# Patient Record
Sex: Female | Born: 1950 | Race: White | Hispanic: No | Smoking: Current every day smoker
Health system: Southern US, Community
[De-identification: ages and names within clinical notes are randomized; demographics above are authoritative.]

## PROBLEM LIST (undated history)

## (undated) DIAGNOSIS — E7800 Pure hypercholesterolemia, unspecified: Secondary | ICD-10-CM

## (undated) DIAGNOSIS — J40 Bronchitis, not specified as acute or chronic: Secondary | ICD-10-CM

## (undated) DIAGNOSIS — Z860100 Personal history of colon polyps, unspecified: Secondary | ICD-10-CM

## (undated) DIAGNOSIS — R05 Cough: Secondary | ICD-10-CM

## (undated) DIAGNOSIS — D509 Iron deficiency anemia, unspecified: Secondary | ICD-10-CM

## (undated) DIAGNOSIS — K922 Gastrointestinal hemorrhage, unspecified: Secondary | ICD-10-CM

## (undated) DIAGNOSIS — M549 Dorsalgia, unspecified: Secondary | ICD-10-CM

## (undated) DIAGNOSIS — I2510 Atherosclerotic heart disease of native coronary artery without angina pectoris: Secondary | ICD-10-CM

## (undated) DIAGNOSIS — G4730 Sleep apnea, unspecified: Secondary | ICD-10-CM

## (undated) DIAGNOSIS — R51 Headache: Secondary | ICD-10-CM

## (undated) DIAGNOSIS — R238 Other skin changes: Secondary | ICD-10-CM

## (undated) DIAGNOSIS — R059 Cough, unspecified: Secondary | ICD-10-CM

## (undated) DIAGNOSIS — F419 Anxiety disorder, unspecified: Secondary | ICD-10-CM

## (undated) DIAGNOSIS — G8929 Other chronic pain: Secondary | ICD-10-CM

## (undated) DIAGNOSIS — R233 Spontaneous ecchymoses: Secondary | ICD-10-CM

## (undated) DIAGNOSIS — K219 Gastro-esophageal reflux disease without esophagitis: Secondary | ICD-10-CM

## (undated) DIAGNOSIS — I10 Essential (primary) hypertension: Secondary | ICD-10-CM

## (undated) DIAGNOSIS — M1990 Unspecified osteoarthritis, unspecified site: Secondary | ICD-10-CM

## (undated) DIAGNOSIS — Q2730 Arteriovenous malformation, site unspecified: Secondary | ICD-10-CM

## (undated) DIAGNOSIS — J449 Chronic obstructive pulmonary disease, unspecified: Secondary | ICD-10-CM

## (undated) HISTORY — PX: ESOPHAGOGASTRODUODENOSCOPY: SHX1529

## (undated) HISTORY — PX: TUBAL LIGATION: SHX77

## (undated) HISTORY — DX: Chronic obstructive pulmonary disease, unspecified: J44.9

## (undated) HISTORY — PX: COLONOSCOPY: SHX174

## (undated) HISTORY — PX: OTHER SURGICAL HISTORY: SHX169

---

## 1992-07-30 HISTORY — PX: ABDOMINAL HYSTERECTOMY: SHX81

## 2003-07-31 HISTORY — PX: CORONARY ANGIOPLASTY: SHX604

## 2004-03-20 ENCOUNTER — Ambulatory Visit (HOSPITAL_COMMUNITY): Admission: RE | Admit: 2004-03-20 | Discharge: 2004-03-21 | Payer: Self-pay | Admitting: Cardiovascular Disease

## 2004-03-20 HISTORY — PX: OTHER SURGICAL HISTORY: SHX169

## 2004-04-11 ENCOUNTER — Observation Stay (HOSPITAL_COMMUNITY): Admission: EM | Admit: 2004-04-11 | Discharge: 2004-04-12 | Payer: Self-pay | Admitting: Emergency Medicine

## 2004-06-08 ENCOUNTER — Emergency Department (HOSPITAL_COMMUNITY): Admission: EM | Admit: 2004-06-08 | Discharge: 2004-06-08 | Payer: Self-pay | Admitting: Emergency Medicine

## 2004-06-27 ENCOUNTER — Ambulatory Visit: Payer: Self-pay | Admitting: Internal Medicine

## 2005-05-09 ENCOUNTER — Ambulatory Visit: Payer: Self-pay | Admitting: Internal Medicine

## 2005-05-25 ENCOUNTER — Ambulatory Visit: Payer: Self-pay | Admitting: Internal Medicine

## 2005-05-25 ENCOUNTER — Encounter (INDEPENDENT_AMBULATORY_CARE_PROVIDER_SITE_OTHER): Payer: Self-pay | Admitting: Specialist

## 2005-05-25 DIAGNOSIS — K2980 Duodenitis without bleeding: Secondary | ICD-10-CM | POA: Insufficient documentation

## 2006-06-11 ENCOUNTER — Other Ambulatory Visit: Admission: RE | Admit: 2006-06-11 | Discharge: 2006-06-11 | Payer: Self-pay | Admitting: *Deleted

## 2007-05-16 ENCOUNTER — Ambulatory Visit: Payer: Self-pay | Admitting: Internal Medicine

## 2007-05-16 LAB — CONVERTED CEMR LAB
ALT: 18 units/L (ref 0–35)
AST: 24 units/L (ref 0–37)
Albumin: 3.5 g/dL (ref 3.5–5.2)
Alkaline Phosphatase: 84 units/L (ref 39–117)
Amylase: 95 units/L (ref 27–131)
BUN: 10 mg/dL (ref 6–23)
Basophils Absolute: 0.1 10*3/uL (ref 0.0–0.1)
Basophils Relative: 1.2 % — ABNORMAL HIGH (ref 0.0–1.0)
Bilirubin, Direct: 0.1 mg/dL (ref 0.0–0.3)
CO2: 31 meq/L (ref 19–32)
Calcium: 8.9 mg/dL (ref 8.4–10.5)
Chloride: 107 meq/L (ref 96–112)
Creatinine, Ser: 0.8 mg/dL (ref 0.4–1.2)
Eosinophils Absolute: 0.2 10*3/uL (ref 0.0–0.6)
Eosinophils Relative: 2.8 % (ref 0.0–5.0)
GFR calc Af Amer: 96 mL/min
GFR calc non Af Amer: 79 mL/min
Glucose, Bld: 196 mg/dL — ABNORMAL HIGH (ref 70–99)
HCT: 38.3 % (ref 36.0–46.0)
Hemoglobin: 13.2 g/dL (ref 12.0–15.0)
Lipase: 41 units/L (ref 11.0–59.0)
Lymphocytes Relative: 38.1 % (ref 12.0–46.0)
MCHC: 34.4 g/dL (ref 30.0–36.0)
MCV: 87 fL (ref 78.0–100.0)
Monocytes Absolute: 0.6 10*3/uL (ref 0.2–0.7)
Monocytes Relative: 6.9 % (ref 3.0–11.0)
Neutro Abs: 4.5 10*3/uL (ref 1.4–7.7)
Neutrophils Relative %: 51 % (ref 43.0–77.0)
Platelets: 312 10*3/uL (ref 150–400)
Potassium: 4.1 meq/L (ref 3.5–5.1)
RBC: 4.41 M/uL (ref 3.87–5.11)
RDW: 13.1 % (ref 11.5–14.6)
Sodium: 142 meq/L (ref 135–145)
Total Bilirubin: 0.4 mg/dL (ref 0.3–1.2)
Total Protein: 6.1 g/dL (ref 6.0–8.3)
WBC: 8.7 10*3/uL (ref 4.5–10.5)

## 2007-05-21 ENCOUNTER — Encounter: Admission: RE | Admit: 2007-05-21 | Discharge: 2007-05-21 | Payer: Self-pay | Admitting: Internal Medicine

## 2007-05-27 ENCOUNTER — Ambulatory Visit (HOSPITAL_COMMUNITY): Admission: RE | Admit: 2007-05-27 | Discharge: 2007-05-27 | Payer: Self-pay | Admitting: Internal Medicine

## 2007-10-02 DIAGNOSIS — K589 Irritable bowel syndrome without diarrhea: Secondary | ICD-10-CM

## 2007-10-02 DIAGNOSIS — D126 Benign neoplasm of colon, unspecified: Secondary | ICD-10-CM

## 2007-10-02 DIAGNOSIS — I10 Essential (primary) hypertension: Secondary | ICD-10-CM

## 2007-10-02 DIAGNOSIS — J449 Chronic obstructive pulmonary disease, unspecified: Secondary | ICD-10-CM

## 2007-10-02 DIAGNOSIS — N809 Endometriosis, unspecified: Secondary | ICD-10-CM | POA: Insufficient documentation

## 2007-10-02 DIAGNOSIS — E7800 Pure hypercholesterolemia, unspecified: Secondary | ICD-10-CM | POA: Insufficient documentation

## 2007-10-02 DIAGNOSIS — J4489 Other specified chronic obstructive pulmonary disease: Secondary | ICD-10-CM | POA: Insufficient documentation

## 2007-10-02 DIAGNOSIS — K2990 Gastroduodenitis, unspecified, without bleeding: Secondary | ICD-10-CM

## 2007-10-02 DIAGNOSIS — I2510 Atherosclerotic heart disease of native coronary artery without angina pectoris: Secondary | ICD-10-CM

## 2007-10-02 DIAGNOSIS — K2970 Gastritis, unspecified, without bleeding: Secondary | ICD-10-CM | POA: Insufficient documentation

## 2008-08-16 ENCOUNTER — Encounter (INDEPENDENT_AMBULATORY_CARE_PROVIDER_SITE_OTHER): Payer: Self-pay | Admitting: *Deleted

## 2008-08-20 ENCOUNTER — Telehealth: Payer: Self-pay | Admitting: Internal Medicine

## 2008-08-25 ENCOUNTER — Ambulatory Visit: Payer: Self-pay | Admitting: Internal Medicine

## 2008-08-25 ENCOUNTER — Telehealth: Payer: Self-pay | Admitting: Internal Medicine

## 2008-08-25 DIAGNOSIS — D509 Iron deficiency anemia, unspecified: Secondary | ICD-10-CM

## 2008-08-26 ENCOUNTER — Encounter: Payer: Self-pay | Admitting: Internal Medicine

## 2008-08-26 ENCOUNTER — Ambulatory Visit: Payer: Self-pay | Admitting: Internal Medicine

## 2008-08-27 ENCOUNTER — Telehealth: Payer: Self-pay | Admitting: Internal Medicine

## 2008-08-28 ENCOUNTER — Encounter: Payer: Self-pay | Admitting: Internal Medicine

## 2008-09-01 ENCOUNTER — Ambulatory Visit: Payer: Self-pay | Admitting: Cardiology

## 2008-09-02 ENCOUNTER — Telehealth: Payer: Self-pay | Admitting: Internal Medicine

## 2008-10-04 ENCOUNTER — Ambulatory Visit: Payer: Self-pay | Admitting: Internal Medicine

## 2008-10-04 DIAGNOSIS — R195 Other fecal abnormalities: Secondary | ICD-10-CM

## 2008-10-04 LAB — CONVERTED CEMR LAB
Basophils Absolute: 0 10*3/uL (ref 0.0–0.1)
Basophils Relative: 0.4 % (ref 0.0–3.0)
Eosinophils Absolute: 0.2 10*3/uL (ref 0.0–0.7)
Eosinophils Relative: 2.9 % (ref 0.0–5.0)
HCT: 38.5 % (ref 36.0–46.0)
Hemoglobin: 12.9 g/dL (ref 12.0–15.0)
Iron: 103 ug/dL (ref 42–145)
Lymphocytes Relative: 35.9 % (ref 12.0–46.0)
MCHC: 33.6 g/dL (ref 30.0–36.0)
MCV: 78.2 fL (ref 78.0–100.0)
Monocytes Absolute: 0.6 10*3/uL (ref 0.1–1.0)
Monocytes Relative: 6.8 % (ref 3.0–12.0)
Neutro Abs: 4.3 10*3/uL (ref 1.4–7.7)
Neutrophils Relative %: 54 % (ref 43.0–77.0)
Platelets: 275 10*3/uL (ref 150–400)
RBC: 4.92 M/uL (ref 3.87–5.11)
RDW: 27.8 % — ABNORMAL HIGH (ref 11.5–14.6)
Saturation Ratios: 28.7 % (ref 20.0–50.0)
Transferrin: 256.4 mg/dL (ref 212.0–360.0)
WBC: 7.8 10*3/uL (ref 4.5–10.5)

## 2008-10-05 ENCOUNTER — Telehealth: Payer: Self-pay | Admitting: Internal Medicine

## 2008-10-14 ENCOUNTER — Ambulatory Visit: Payer: Self-pay | Admitting: Gastroenterology

## 2008-10-14 ENCOUNTER — Encounter: Payer: Self-pay | Admitting: Internal Medicine

## 2008-10-27 ENCOUNTER — Telehealth: Payer: Self-pay | Admitting: Internal Medicine

## 2009-07-30 HISTORY — PX: BACK SURGERY: SHX140

## 2009-10-21 ENCOUNTER — Encounter: Payer: Self-pay | Admitting: Internal Medicine

## 2009-11-02 ENCOUNTER — Ambulatory Visit: Payer: Self-pay | Admitting: Internal Medicine

## 2009-11-02 ENCOUNTER — Telehealth: Payer: Self-pay | Admitting: Internal Medicine

## 2009-11-02 LAB — CONVERTED CEMR LAB
Basophils Absolute: 0 10*3/uL (ref 0.0–0.1)
Basophils Relative: 0.5 % (ref 0.0–3.0)
Eosinophils Absolute: 0.2 10*3/uL (ref 0.0–0.7)
Eosinophils Relative: 2.9 % (ref 0.0–5.0)
HCT: 35.1 % — ABNORMAL LOW (ref 36.0–46.0)
Hemoglobin: 11.9 g/dL — ABNORMAL LOW (ref 12.0–15.0)
Iron: 23 ug/dL — ABNORMAL LOW (ref 42–145)
Lymphocytes Relative: 32.4 % (ref 12.0–46.0)
Lymphs Abs: 2.3 10*3/uL (ref 0.7–4.0)
MCHC: 33.8 g/dL (ref 30.0–36.0)
MCV: 86.2 fL (ref 78.0–100.0)
Monocytes Absolute: 0.5 10*3/uL (ref 0.1–1.0)
Monocytes Relative: 7.4 % (ref 3.0–12.0)
Neutro Abs: 3.9 10*3/uL (ref 1.4–7.7)
Neutrophils Relative %: 56.8 % (ref 43.0–77.0)
Platelets: 329 10*3/uL (ref 150.0–400.0)
RBC: 4.07 M/uL (ref 3.87–5.11)
RDW: 14.5 % (ref 11.5–14.6)
Saturation Ratios: 6.1 % — ABNORMAL LOW (ref 20.0–50.0)
Transferrin: 271.1 mg/dL (ref 212.0–360.0)
Vitamin B-12: 376 pg/mL (ref 211–911)
WBC: 6.9 10*3/uL (ref 4.5–10.5)

## 2009-11-08 ENCOUNTER — Encounter: Payer: Self-pay | Admitting: Internal Medicine

## 2009-11-11 ENCOUNTER — Telehealth: Payer: Self-pay | Admitting: Internal Medicine

## 2009-11-11 ENCOUNTER — Encounter (HOSPITAL_COMMUNITY): Admission: RE | Admit: 2009-11-11 | Discharge: 2010-02-09 | Payer: Self-pay | Admitting: Internal Medicine

## 2010-01-03 ENCOUNTER — Ambulatory Visit: Payer: Self-pay | Admitting: Internal Medicine

## 2010-01-03 LAB — CONVERTED CEMR LAB
Basophils Absolute: 0.1 10*3/uL (ref 0.0–0.1)
Basophils Relative: 0.9 % (ref 0.0–3.0)
Eosinophils Absolute: 0.3 10*3/uL (ref 0.0–0.7)
Eosinophils Relative: 3.4 % (ref 0.0–5.0)
HCT: 39.8 % (ref 36.0–46.0)
Hemoglobin: 13.5 g/dL (ref 12.0–15.0)
Iron: 96 ug/dL (ref 42–145)
Lymphocytes Relative: 27.8 % (ref 12.0–46.0)
Lymphs Abs: 2.1 10*3/uL (ref 0.7–4.0)
MCHC: 34 g/dL (ref 30.0–36.0)
MCV: 86.1 fL (ref 78.0–100.0)
Monocytes Absolute: 0.6 10*3/uL (ref 0.1–1.0)
Monocytes Relative: 8 % (ref 3.0–12.0)
Neutro Abs: 4.5 10*3/uL (ref 1.4–7.7)
Neutrophils Relative %: 59.9 % (ref 43.0–77.0)
Platelets: 267 10*3/uL (ref 150.0–400.0)
RBC: 4.62 M/uL (ref 3.87–5.11)
RDW: 17.4 % — ABNORMAL HIGH (ref 11.5–14.6)
Saturation Ratios: 31.3 % (ref 20.0–50.0)
Transferrin: 219.4 mg/dL (ref 212.0–360.0)
WBC: 7.5 10*3/uL (ref 4.5–10.5)

## 2010-01-04 ENCOUNTER — Ambulatory Visit: Payer: Self-pay | Admitting: Internal Medicine

## 2010-02-12 ENCOUNTER — Encounter: Admission: RE | Admit: 2010-02-12 | Discharge: 2010-02-12 | Payer: Self-pay | Admitting: Orthopedic Surgery

## 2010-03-25 ENCOUNTER — Encounter: Admission: RE | Admit: 2010-03-25 | Discharge: 2010-03-25 | Payer: Self-pay | Admitting: Orthopedic Surgery

## 2010-04-04 ENCOUNTER — Ambulatory Visit: Payer: Self-pay | Admitting: Internal Medicine

## 2010-05-22 ENCOUNTER — Ambulatory Visit: Payer: Self-pay | Admitting: Surgery

## 2010-06-05 LAB — CONVERTED CEMR LAB
Basophils Absolute: 0.1 10*3/uL (ref 0.0–0.1)
Basophils Relative: 0.7 % (ref 0.0–3.0)
Eosinophils Absolute: 0.2 10*3/uL (ref 0.0–0.7)
Eosinophils Relative: 2.5 % (ref 0.0–5.0)
HCT: 39.5 % (ref 36.0–46.0)
Hemoglobin: 13.6 g/dL (ref 12.0–15.0)
Iron: 39 ug/dL — ABNORMAL LOW (ref 42–145)
Lymphocytes Relative: 35.3 % (ref 12.0–46.0)
Lymphs Abs: 2.9 10*3/uL (ref 0.7–4.0)
MCHC: 34.6 g/dL (ref 30.0–36.0)
MCV: 90.4 fL (ref 78.0–100.0)
Monocytes Absolute: 0.7 10*3/uL (ref 0.1–1.0)
Monocytes Relative: 8.3 % (ref 3.0–12.0)
Neutro Abs: 4.4 10*3/uL (ref 1.4–7.7)
Neutrophils Relative %: 53.2 % (ref 43.0–77.0)
Platelets: 288 10*3/uL (ref 150.0–400.0)
RBC: 4.36 M/uL (ref 3.87–5.11)
RDW: 15.1 % — ABNORMAL HIGH (ref 11.5–14.6)
Saturation Ratios: 11.4 % — ABNORMAL LOW (ref 20.0–50.0)
Transferrin: 245.2 mg/dL (ref 212.0–360.0)
WBC: 8.3 10*3/uL (ref 4.5–10.5)

## 2010-06-19 ENCOUNTER — Ambulatory Visit: Payer: Self-pay | Admitting: Internal Medicine

## 2010-06-20 LAB — CONVERTED CEMR LAB
Basophils Absolute: 0.1 10*3/uL (ref 0.0–0.1)
Eosinophils Absolute: 0.2 10*3/uL (ref 0.0–0.7)
Hemoglobin: 13.3 g/dL (ref 12.0–15.0)
Lymphocytes Relative: 39.8 % (ref 12.0–46.0)
MCHC: 33.9 g/dL (ref 30.0–36.0)
MCV: 90.6 fL (ref 78.0–100.0)
Monocytes Absolute: 0.5 10*3/uL (ref 0.1–1.0)
Neutro Abs: 3.7 10*3/uL (ref 1.4–7.7)
Neutrophils Relative %: 49.9 % (ref 43.0–77.0)
RDW: 13.9 % (ref 11.5–14.6)
Saturation Ratios: 20.7 % (ref 20.0–50.0)

## 2010-06-28 ENCOUNTER — Inpatient Hospital Stay (HOSPITAL_COMMUNITY): Admission: RE | Admit: 2010-06-28 | Discharge: 2010-06-30 | Payer: Self-pay | Admitting: Orthopedic Surgery

## 2010-06-29 ENCOUNTER — Encounter (INDEPENDENT_AMBULATORY_CARE_PROVIDER_SITE_OTHER): Payer: Self-pay | Admitting: Orthopedic Surgery

## 2010-08-29 NOTE — Progress Notes (Signed)
Summary: Follow up W/ Dr Juanda Chance?   Phone Note Call from Patient Call back at [phone]   Call For: Dr Juanda Chance Reason for Call: Talk to Nurse Summary of Call: When does she need to follow up with Dr Juanda Chance? Initial call taken by: Leanor Kail [facility],  November 11, 2009 5:15 PM  Follow-up for Phone Call        Dr Juanda Chance ~ When would you like to see patient in follow up? Follow-up by: Hortense Ramal CMA Duncan Dull),  November 14, 2009 8:48 AM  Additional Follow-up for Phone Call Additional follow up Details #1::        beginning of June, please have CBC, Iron studies prior to her visit. Additional Follow-up by: Hart Carwin MD,  November 14, 2009 1:29 PM    Additional Follow-up for Phone Call Additional follow up Details #2::    I have left a message for the patient to call back.  Hortense Ramal CMA Duncan Dull)  November 14, 2009 4:07 PM   Patient has been scheduled for follow up appointment on 01/04/10 and will have labs drawn beforehand. She would like to know if she needs to restart her iron supplements since her H/H and iron saturations were low on her labs drawn 11/02/09.Marland KitchenMarland KitchenMarland KitchenShe had an iron infusion but is unclear as to what she needs to do next....please advise. Hortense Ramal CMA Duncan Dull)  November 15, 2009 8:57 AM   Additional Follow-up for Phone Call Additional follow up Details #3:: Details for Additional Follow-up Action Taken: She ought to restart the iron supplements now. Hart Carwin MD,  November 15, 2009 10:38 PM  Patient advised to restart iron supplements. She verbalizes understanding. Hortense Ramal CMA Duncan Dull)  November 16, 2009 8:17 AM

## 2010-08-29 NOTE — Progress Notes (Signed)
Summary: Iron Infusion Scheduled   Phone Note Outgoing Call   Call placed by: Laureen Ochs LPN,  November 02, 2009 3:39 PM Call placed to: Patient Summary of Call: See labs from 11-02-09. Pt. is scheduled for an Iron infusion, with a test dose first, at [facility] Short Stay on 11-04-09 at 8am. Pt. changed the appt. to 11-11-09. All instructions given to pt. by phone. Pt. instructed to call back as needed.  Initial call taken by: Laureen Ochs LPN,  November 02, 2009 3:40 PM     Appended Document: Iron Infusion Orders    Clinical Lists Changes  Orders: Added new Test order of Iron Infusion (Iron Inf) - Signed

## 2010-08-29 NOTE — Letter (Signed)
Summary: Surgery Clearance/[facility]  Surgery Clearance/[facility]   Imported By: Lester  04/26/2010 10:56:55  _____________________________________________________________________  External Attachment:    Type:   Image     Comment:   External Document

## 2010-08-29 NOTE — Assessment & Plan Note (Signed)
Summary: routine f/y anemia//dn    History of Present Illness Visit Type: Follow-up Visit Primary GI MD: Lina Sar MD Primary Provider: Karyl Kinnier, MD Requesting Provider: n/a Chief Complaint: Patient here for f/u anemia. She denies any GI symptoms at this time. History of Present Illness:   This is a 60 year old white female with iron deficiency anemia which has responded to oral iron supplements and an iron infusion. A small bowel capsule endoscopy in March showed one erosion, one small polyp and one AVM. She is feeling much better now. Her hemoglobin is up to 13.5, hematocrit is 39.8 and her iron saturation is up to 31%. She is also on B12 1,000 mcg monthly. She continues her Plavix and aspirin for coronary artery disease and is status post drug-eluting stent placement. She is up-to-date on her colonoscopy which was done in January 2010. That showed a hyperplastic polyp. She has a family history of colon cancer in her father. An upper endoscopy in January 2010 was essentially negative.   GI Review of Systems      Denies abdominal pain, acid reflux, belching, bloating, chest pain, dysphagia with liquids, dysphagia with solids, heartburn, loss of appetite, nausea, vomiting, vomiting blood, weight loss, and  weight gain.        Denies anal fissure, black tarry stools, change in bowel habit, constipation, diarrhea, diverticulosis, fecal incontinence, heme positive stool, hemorrhoids, irritable bowel syndrome, jaundice, light color stool, liver problems, rectal bleeding, and  rectal pain.    Current Medications (verified): 1)  Plavix 75 Mg Tabs (Clopidogrel Bisulfate) .Marland Kitchen.. 1 By Mouth Once Daily 2)  Adult Aspirin Low Strength 81 Mg Tbdp (Aspirin) .Marland Kitchen.. 1 By Mouth Once Daily 3)  Altace 2.5 Mg Caps (Ramipril) .... 2 Tabs By Mouth Once Daily 4)  Metoprolol Tartrate 25 Mg Tabs (Metoprolol Tartrate) .... Take 1/2 Tab By Mouth Two Times A Day 5)  Zocor 40 Mg Tabs (Simvastatin) .Marland Kitchen.. 1 By Mouth  Once Daily 6)  Nexium 40 Mg Cpdr (Esomeprazole Magnesium) .Marland Kitchen.. 1 Tab By Mouth Once Daily 7)  Eql Fish Oil 1000 Mg Caps (Omega-3 Fatty Acids) .Marland Kitchen.. 1 By Mouth Two Times A Day 8)  Calcium 1200 1200-1000 Mg-Unit Chew (Calcium Carbonate-Vit D-Min) .Marland Kitchen.. 1 By Mouth Once Daily 9)  Ferrous Sulfate 325 (65 Fe) Mg Tabs (Ferrous Sulfate) .Marland Kitchen.. 1 Tab Three Times A Day 10)  Folic Acid 1 Mg Tabs (Folic Acid) .Marland Kitchen.. 1 By Mouth Once Daily 11)  Cyanocobalamin 1000 Mcg/ml Soln (Cyanocobalamin) .... Inject 1 Ml Intramuscularly (In Thigh or Arm) Once Per Month. **pharmacy Please Include Appropriate Syringes** 12)  Ambien 5 Mg Tabs (Zolpidem Tartrate) .... Take 1 Tab By Mouth At Bedtime As Needed  Allergies (verified): 1)  ! Codeine  Past History:  Past Medical History: Reviewed history from 10/02/2007 and no changes required. Current Problems:  COPD (ICD-496) ENDOMETRIOSIS (ICD-617.9) IBS (ICD-564.1) COLONIC POLYPS (ICD-211.3) DUODENITIS, WITHOUT HEMORRHAGE (ICD-535.60) GASTRITIS (ICD-535.50) HYPERCHOLESTEROLEMIA (ICD-272.0) CAD (ICD-414.00) HYPERTENSION (ICD-401.9)  Past Surgical History: Reviewed history from 10/02/2007 and no changes required. total hysterectomy tubal ligation angioplasty/stent  Family History: Reviewed history from 08/25/2008 and no changes required. Father recently deceased as of 08-24-08 Family History of Breast Cancer:Sister Family History of Colon Cancer: father Family History of Prostate Cancer:Father Family History of Diabetes: Father Family History of Heart Disease: Father Family History of Liver Disease/Cirrhosis:Sister  Social History: Reviewed history from 08/25/2008 and no changes required. 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  Alcohol Use - no Daily Caffeine Use1  cup Illicit Drug Use - no Patient gets regular exercise.  Review of Systems       The patient complains of anemia.  The patient denies allergy/sinus, anxiety-new,  arthritis/joint pain, back pain, blood in urine, breast changes/lumps, change in vision, confusion, cough, coughing up blood, depression-new, fainting, fatigue, fever, headaches-new, hearing problems, heart murmur, heart rhythm changes, itching, menstrual pain, muscle pains/cramps, night sweats, nosebleeds, pregnancy symptoms, shortness of breath, skin rash, sleeping problems, sore throat, swelling of feet/legs, swollen lymph glands, thirst - excessive , urination - excessive , urination changes/pain, urine leakage, vision changes, and voice change.         Pertinent positive and negative review of systems were noted in the above HPI. All other ROS was otherwise negative.   Vital Signs:  Patient profile:   60 year old female Height:      61 inches Weight:      160.25 pounds BMI:     30.39 BSA:     1.72 Pulse rate:   72 / minute Pulse rhythm:   regular BP sitting:   122 / 80  (left arm)  Vitals Entered By: Lamona Curl CMA Duncan Dull) (January 04, 2010 8:31 AM)  Physical Exam  General:  Well developed, well nourished, no acute distress. Neck:  Supple; no masses or thyromegaly. Lungs:  Clear throughout to auscultation. Heart:  Regular rate and rhythm; no murmurs, rubs,  or bruits. Abdomen:  soft with mild tenderness in left lower quadrant. Hyperactive bowel sounds. No palpable mass Extremities:  No clubbing, cyanosis, edema or deformities noted. Skin:  palmar erythema   Impression & Recommendations:  Problem # 1:  FM HX MALIGNANT NEOPLASM GASTROINTESTINAL TRACT (ICD-V16.0) She is up-to-date on her colonoscopy. Her next exam will be due in January 2015.  Problem # 2:  IBS (ICD-564.1) We will consider an upper abdominal ultrasound. She has a strong family history of gallbladder disease.  Problem # 3:  ANEMIA, IRON DEFICIENCY (ICD-280.9) This has been corrected with an iron infusion and oral iron supplements. She may discontinue her iron supplements at this time and have her CBC  repeated in 3 months.  Patient Instructions: 1)  repeat CBC 3 months. 2)  Discontinue oral iron. 3)  Consider upper abdominal ultrasound for followup of bloating. 4)  Recall colonoscopy January 2015. 5)  Copy sent to : Dr Ethelene Hal 6)  The medication list was reviewed and reconciled.  All changed / newly prescribed medications were explained.  A complete medication list was provided to the patient / caregiver.

## 2010-08-29 NOTE — Miscellaneous (Signed)
Summary: Check Labs before Refills  ---- 10/21/2009 11:31 AM, Hortense Ramal CMA (AAMA) wrote: Dr Juanda Chance  I have a refill request for patient to get injectable b12.Marland KitchenMarland KitchenMarland Kitchenhowever, I dont see where we have gotten a b12 level on her in a long time. We got an IBC panel 3/8 of last year and it was normal....do you want Patient to have labs drawn before I refill? If so, which ones exactly would you like?   ---- 10/21/2009 1:10 PM, Hart Carwin MD wrote: last B12 level 07/2008 was 264pg, OK to repeat before refilling her B12., please check Iron studies, CBC  at the same time DB   I have left a message for patient to call back. Her lab orders have been entered in [facility]. Hortense Ramal CMA Duncan Dull)  October 21, 2009 1:45 PM    Patient has been advised that Dr Juanda Chance would like her to come for labs before we give her refills on her b12 refills. Patient verbalizes understanding. Hortense Ramal CMA Duncan Dull)  October 24, 2009 11:14 AM

## 2010-08-29 NOTE — Miscellaneous (Signed)
Summary: B12 Refill  Clinical Lists Changes  Medications: Rx of CYANOCOBALAMIN 1000 MCG/ML SOLN (CYANOCOBALAMIN) Inject 1 mL intramuscularly (in thigh or arm) once per month. **pharmacy please include appropriate syringes**;  #10 x 0;  Signed;  Entered by: Hortense Ramal CMA (AAMA);  Authorized by: Hart Carwin MD;  Method used: Electronically to [facility]. [phone]*, [address], [place], [place]  [address], Ph: [phone], Fax: [phone]    Prescriptions: CYANOCOBALAMIN 1000 MCG/ML SOLN (CYANOCOBALAMIN) Inject 1 mL intramuscularly (in thigh or arm) once per month. **pharmacy please include appropriate syringes**  #10 x 0   Entered by:   Hortense Ramal CMA (AAMA)   Authorized by:   Hart Carwin MD   Signed by:   Hortense Ramal CMA (AAMA) on 11/08/2009   Method used:   Electronically to        [facility]. [phone]* (retail)       [address]       [place], [place]  [address]       Ph: [phone]       Fax: [phone]   RxID:   [id number]

## 2010-10-04 IMAGING — CT CT ABDOMEN W/ CM
2 of 5 series · 17 of 46 positions shown, 19 images · IV contrast (Omnipaque 300)
Comparison: None

CT ABDOMEN

CLINICAL DATA: Anemia.  Family history of colon cancer.  Upper
abdominal pain.  Bloating.  Weight loss.

CT ABDOMEN AND PELVIS WITH CONTRAST
TECHNIQUE: Multidetector CT imaging of the abdomen and pelvis was
performed using the standard protocol following bolus
administration of intravenous contrast.
Contrast: 100 ml 2mnipaque-EII

[Series 2: abd_pel 5.0 b30f st · axial · 0.73mm/px · z∈[-426,-51]mm · 14 of 85 slices shown, 16 images]
[im 5/85  soft-tissue]
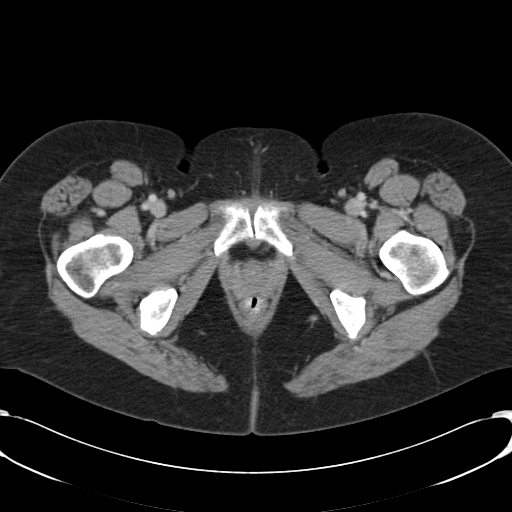
[im 5/85  bone]
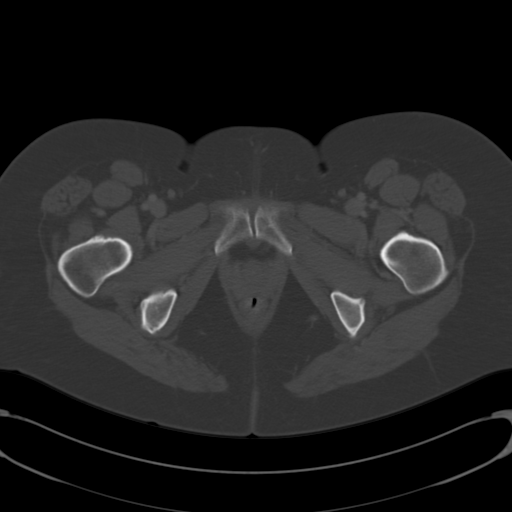
[im 10/85  soft-tissue]
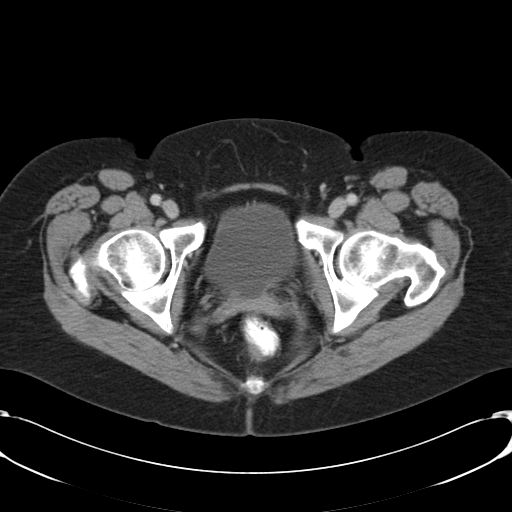
[im 19/85  soft-tissue]
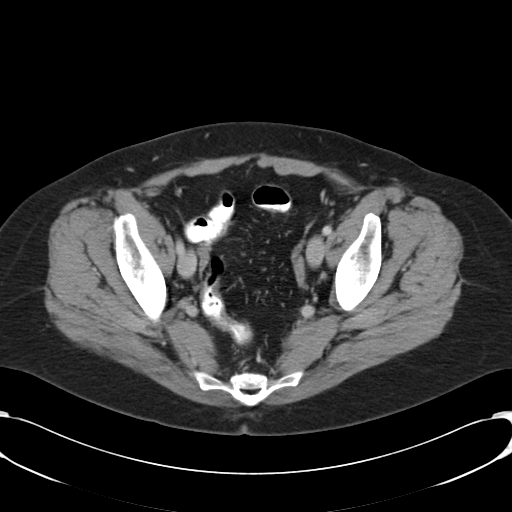
[im 24/85  soft-tissue]
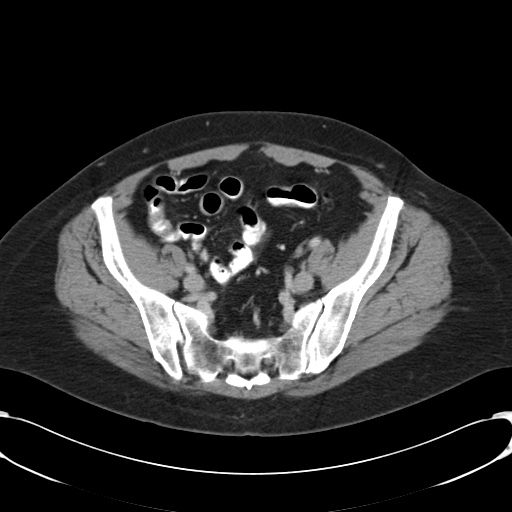
[im 29/85  soft-tissue]
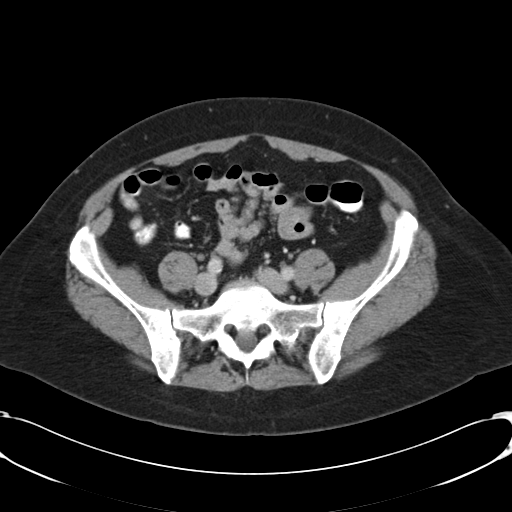
[im 33/85  soft-tissue]
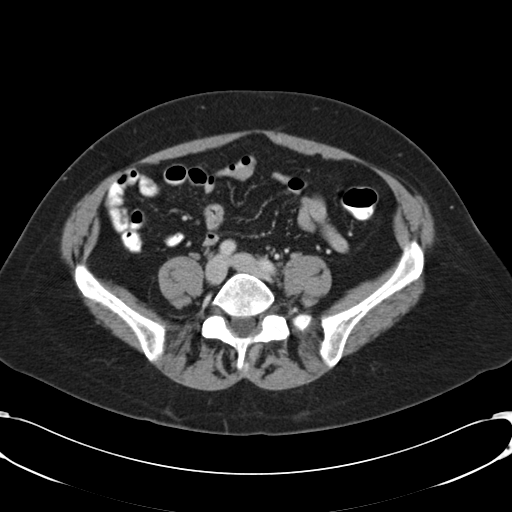
[im 38/85  soft-tissue]
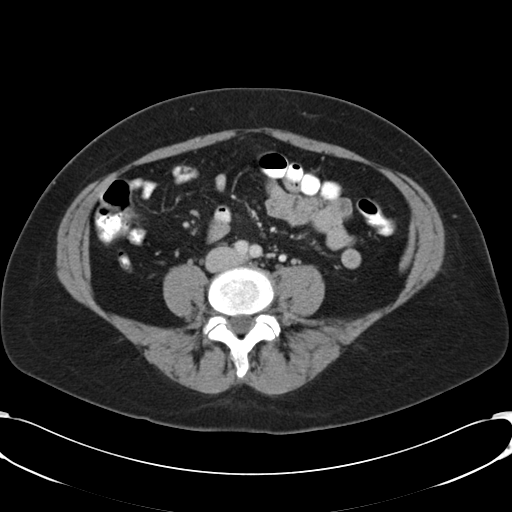
[im 47/85  soft-tissue]
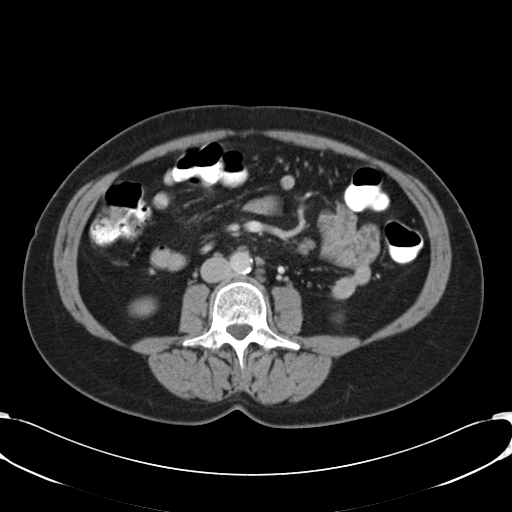
[im 52/85  soft-tissue]
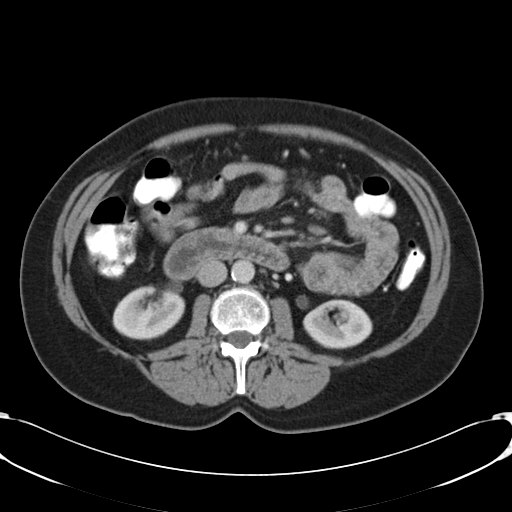
[im 52/85  bone]
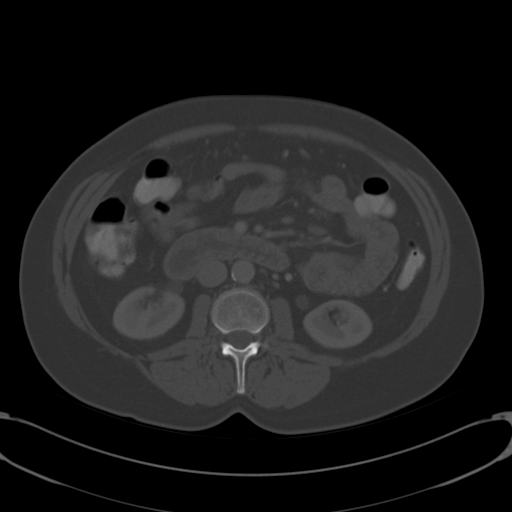
[im 57/85  soft-tissue]
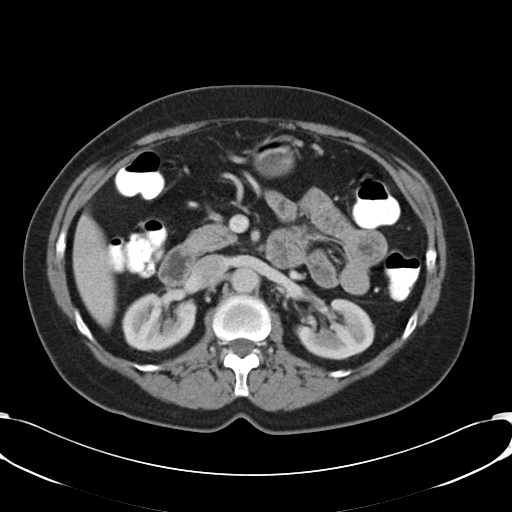
[im 61/85  soft-tissue]
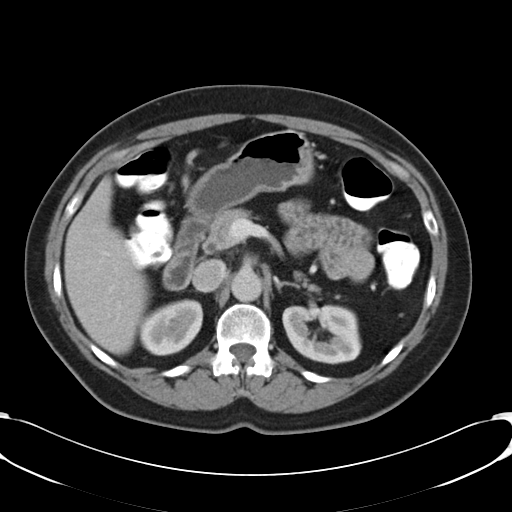
[im 66/85  soft-tissue]
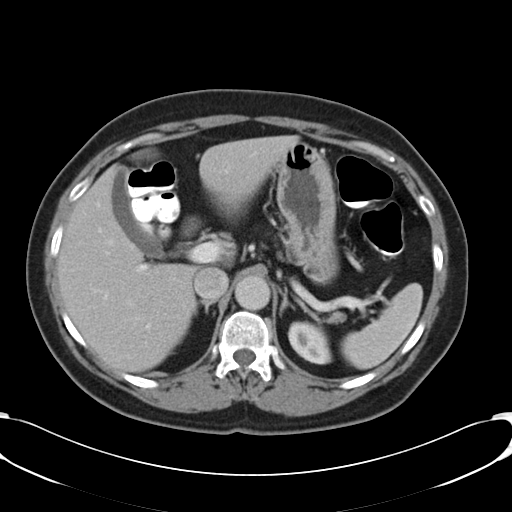
[im 75/85  soft-tissue]
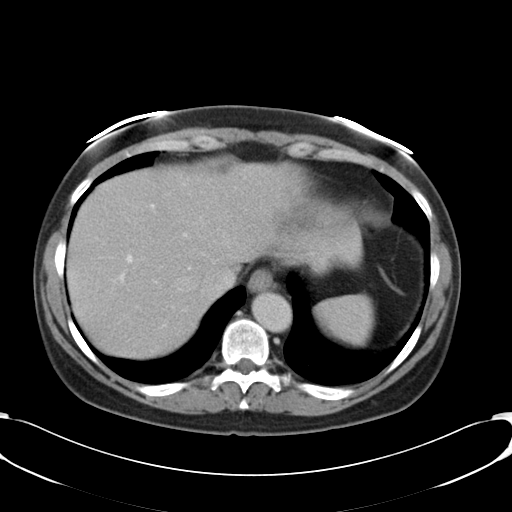
[im 80/85  soft-tissue]
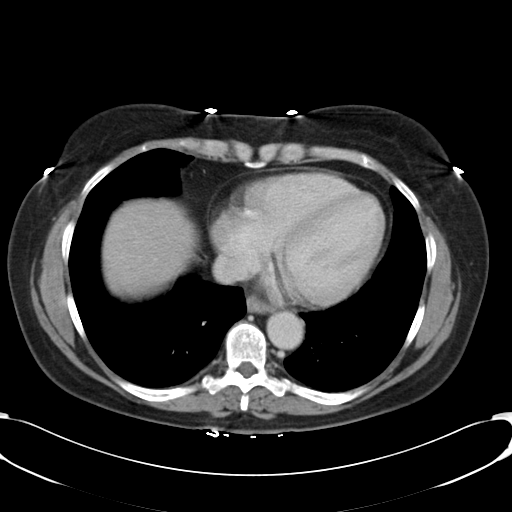

[Series 602: <mpr thick range> · coronal · 0.84mm/px · 3 of 82 slices shown]
[im 28/82  soft-tissue]
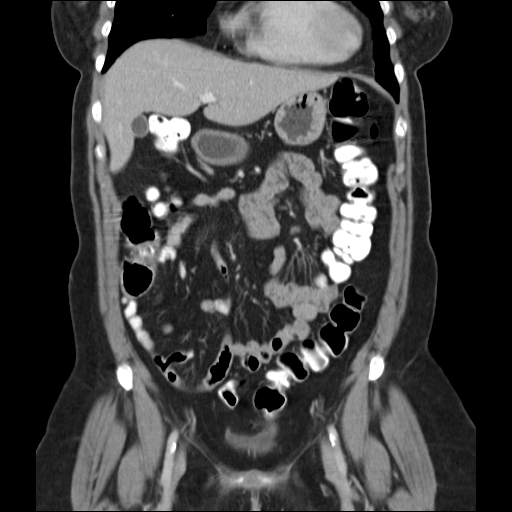
[im 37/82  soft-tissue]
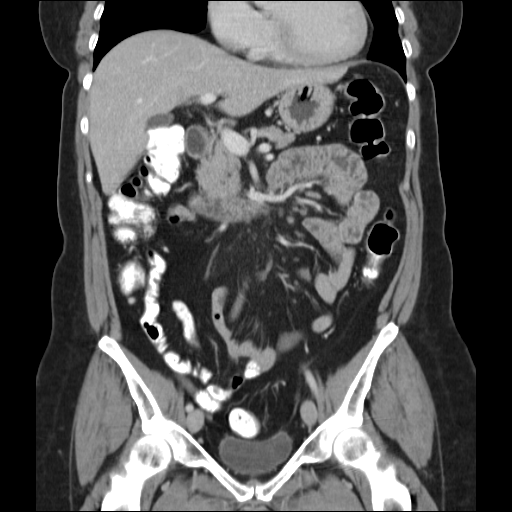
[im 46/82  soft-tissue]
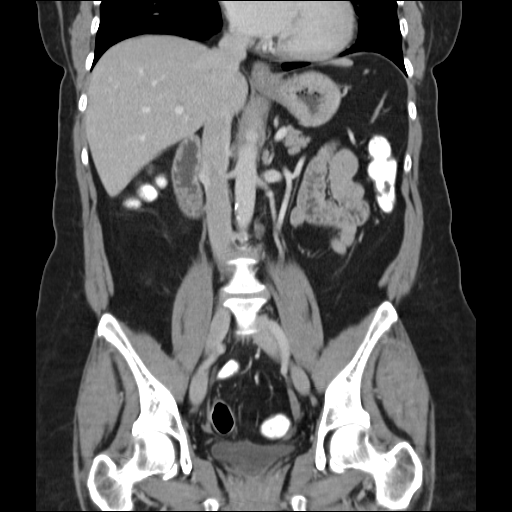

[17 of 46 positions shown; findings below may reference images not displayed]

FINDINGS: Lung bases are clear.  No pleural or pericardial fluid.
The liver has a normal appearance without focal lesions or biliary
ductal dilatation.  No calcified gallstones.  The spleen is normal.
The pancreas is normal.  The adrenal glands are normal.  The
kidneys are normal.  There is atherosclerosis of the aorta but no
aneurysm.  The IVC is normal.  No retroperitoneal mass or
adenopathy.  No free intraperitoneal fluid or air.  No bowel
pathology is evident in the abdominal portion of the scan.
IMPRESSION: Normal CT scan of the abdomen with the exception of atherosclerosis
of the aorta.

CT PELVIS
FINDINGS: No free fluid in the pelvis.  There has been previous
hysterectomy.  No sign of pelvic mass or adenopathy.  The appendix
appears normal.  No other bowel pathology is evident.  No
significant osseous finding.
IMPRESSION: Negative CT scan of the pelvis.

## 2010-10-09 LAB — URINE CULTURE: Colony Count: NO GROWTH

## 2010-10-09 LAB — URINALYSIS, ROUTINE W REFLEX MICROSCOPIC
Bilirubin Urine: NEGATIVE
Ketones, ur: NEGATIVE mg/dL
Nitrite: NEGATIVE
Specific Gravity, Urine: 1.01 (ref 1.005–1.030)
Urobilinogen, UA: 0.2 mg/dL (ref 0.0–1.0)
pH: 6 (ref 5.0–8.0)

## 2010-10-09 LAB — HEMOGLOBIN AND HEMATOCRIT, BLOOD
HCT: 28.1 % — ABNORMAL LOW (ref 36.0–46.0)
HCT: 30.3 % — ABNORMAL LOW (ref 36.0–46.0)
Hemoglobin: 9.2 g/dL — ABNORMAL LOW (ref 12.0–15.0)

## 2010-10-10 LAB — CBC
HCT: 39.5 % (ref 36.0–46.0)
MCHC: 33.7 g/dL (ref 30.0–36.0)
MCV: 89.2 fL (ref 78.0–100.0)
Platelets: 273 10*3/uL (ref 150–400)
RDW: 13.5 % (ref 11.5–15.5)
WBC: 7.8 10*3/uL (ref 4.0–10.5)

## 2010-10-10 LAB — TYPE AND SCREEN
Unit division: 0
Unit division: 0

## 2010-10-10 LAB — BASIC METABOLIC PANEL
BUN: 8 mg/dL (ref 6–23)
Chloride: 106 mEq/L (ref 96–112)
Creatinine, Ser: 0.84 mg/dL (ref 0.4–1.2)
Glucose, Bld: 89 mg/dL (ref 70–99)
Potassium: 4 mEq/L (ref 3.5–5.1)

## 2010-10-10 LAB — ABO/RH: ABO/RH(D): AB POS

## 2010-10-10 LAB — POCT I-STAT 4, (NA,K, GLUC, HGB,HCT)
Glucose, Bld: 115 mg/dL — ABNORMAL HIGH (ref 70–99)
HCT: 27 % — ABNORMAL LOW (ref 36.0–46.0)
Potassium: 3.2 mEq/L — ABNORMAL LOW (ref 3.5–5.1)
Sodium: 140 mEq/L (ref 135–145)

## 2010-10-16 ENCOUNTER — Other Ambulatory Visit: Payer: Self-pay

## 2010-10-19 ENCOUNTER — Telehealth: Payer: Self-pay | Admitting: *Deleted

## 2010-10-19 NOTE — Telephone Encounter (Signed)
Left message for patient to call back. She needs repeat labs.

## 2010-10-19 NOTE — Telephone Encounter (Signed)
Patient states that she will come for labs sometime between now and 10/26/10. Advised her to go to basement for labs.

## 2010-10-19 NOTE — Telephone Encounter (Signed)
Message copied by Vernia Buff on Thu Oct 19, 2010  3:33 PM ------      Message from: Vernia Buff      Created: Mon Oct 16, 2010 12:42 PM       Called patient. Did she call back?      ---- 06/20/2010 9:22 AM, Jesse Fall RN wrote:      Patient due for labs week on 10/15/09.

## 2010-11-06 ENCOUNTER — Other Ambulatory Visit (INDEPENDENT_AMBULATORY_CARE_PROVIDER_SITE_OTHER): Payer: Self-pay

## 2010-11-06 ENCOUNTER — Telehealth: Payer: Self-pay | Admitting: Internal Medicine

## 2010-11-06 DIAGNOSIS — D509 Iron deficiency anemia, unspecified: Secondary | ICD-10-CM

## 2010-11-06 LAB — CBC WITH DIFFERENTIAL/PLATELET
Basophils Absolute: 0.1 10*3/uL (ref 0.0–0.1)
Basophils Relative: 1 % (ref 0.0–3.0)
Eosinophils Absolute: 0.2 10*3/uL (ref 0.0–0.7)
MCHC: 31.5 g/dL (ref 30.0–36.0)
MCV: 73.2 fl — ABNORMAL LOW (ref 78.0–100.0)
Monocytes Absolute: 0.8 10*3/uL (ref 0.1–1.0)
Neutrophils Relative %: 49.1 % (ref 43.0–77.0)
RBC: 3.42 Mil/uL — ABNORMAL LOW (ref 3.87–5.11)
RDW: 22.2 % — ABNORMAL HIGH (ref 11.5–14.6)

## 2010-11-06 LAB — IBC PANEL
Iron: 12 ug/dL — ABNORMAL LOW (ref 42–145)
Saturation Ratios: 2.6 % — ABNORMAL LOW (ref 20.0–50.0)
Transferrin: 330.8 mg/dL (ref 212.0–360.0)

## 2010-11-07 ENCOUNTER — Telehealth: Payer: Self-pay | Admitting: *Deleted

## 2010-11-07 DIAGNOSIS — D509 Iron deficiency anemia, unspecified: Secondary | ICD-10-CM

## 2010-11-07 MED ORDER — IRON DEXTRAN 50 MG/ML IJ SOLN: INTRAMUSCULAR | Status: DC

## 2010-11-07 NOTE — Telephone Encounter (Signed)
Scheduled patient at [facility] short stay([place])  On 11/09/10 at 10:00 AM for IV iron. Left message for patient with the appointment. Orders faxed to Short stay.

## 2010-11-07 NOTE — Telephone Encounter (Signed)
Message copied by Jesse Fall on Tue Nov 07, 2010  8:41 AM ------      Message from: Lina Sar      Created: Mon Nov 06, 2010  5:46 PM       I have discussed  Low Hgb 7.9 with the pt, who is feeling slightly SOB, but otherwise OK. Hgb 9.2 in Dec [address]4540, had back surgery Nov,2011, Hgb before the surgery was 13.3. Please set up an Iron Infusion at [facility]. ASAP,she will continue oral iron in the meantime. Imfed IV infusion , NO test dose needed since she already had 1 infusion. Please give Pharmacy calculated dose, goal Hgb 13.5. Call pt on her mobile phone.

## 2010-11-08 ENCOUNTER — Telehealth: Payer: Self-pay | Admitting: Internal Medicine

## 2010-11-08 NOTE — Telephone Encounter (Signed)
Orders refaxed. Short stay notified.

## 2010-11-08 NOTE — Telephone Encounter (Signed)
Spoke with pt re: iron infusion, discussed severe anemia, , continue oral iron for now. We will arrange for an iron infusion ASAP.

## 2010-11-09 ENCOUNTER — Encounter (HOSPITAL_COMMUNITY): Payer: BC Managed Care – HMO | Attending: Internal Medicine

## 2010-11-09 ENCOUNTER — Other Ambulatory Visit (INDEPENDENT_AMBULATORY_CARE_PROVIDER_SITE_OTHER): Payer: BC Managed Care – HMO

## 2010-11-09 ENCOUNTER — Other Ambulatory Visit: Payer: Self-pay | Admitting: Internal Medicine

## 2010-11-09 DIAGNOSIS — D509 Iron deficiency anemia, unspecified: Secondary | ICD-10-CM

## 2010-11-09 NOTE — Telephone Encounter (Signed)
Left message for patient to call back  

## 2010-11-09 NOTE — Telephone Encounter (Signed)
Patient calling stating that she needs refills on B12 injectable. She has not given herself an injection since December 2012. Dr Juanda Chance, do you want patient to have a b12 level checked before we refill rx or do you want me to go ahead and give her the rx?

## 2010-11-09 NOTE — Telephone Encounter (Signed)
Yes, check B12 level, please.

## 2010-11-09 NOTE — Telephone Encounter (Signed)
Pt states that she will come for b12 levels today and once we get levels back, we can then let her know if she needs b12 injections.

## 2010-11-14 ENCOUNTER — Telehealth: Payer: Self-pay | Admitting: Internal Medicine

## 2010-11-14 NOTE — Telephone Encounter (Signed)
317pg, low normal,  DB

## 2010-11-14 NOTE — Telephone Encounter (Signed)
Patient wants to know the results of her Vitamin B 12 level. Please, advise

## 2010-11-15 NOTE — Telephone Encounter (Signed)
Left message with results as per Dr. Juanda Chance.

## 2010-12-04 ENCOUNTER — Telehealth: Payer: Self-pay | Admitting: Internal Medicine

## 2010-12-04 ENCOUNTER — Other Ambulatory Visit (INDEPENDENT_AMBULATORY_CARE_PROVIDER_SITE_OTHER): Payer: BC Managed Care – HMO

## 2010-12-04 DIAGNOSIS — D509 Iron deficiency anemia, unspecified: Secondary | ICD-10-CM

## 2010-12-04 LAB — IBC PANEL
Saturation Ratios: 18.4 % — ABNORMAL LOW (ref 20.0–50.0)
Transferrin: 237.1 mg/dL (ref 212.0–360.0)

## 2010-12-04 LAB — CBC WITH DIFFERENTIAL/PLATELET
Basophils Relative: 1 % (ref 0.0–3.0)
Eosinophils Relative: 2.4 % (ref 0.0–5.0)
Hemoglobin: 10.6 g/dL — ABNORMAL LOW (ref 12.0–15.0)
Lymphocytes Relative: 32.2 % (ref 12.0–46.0)
MCV: 80.7 fl (ref 78.0–100.0)
Neutro Abs: 4.7 10*3/uL (ref 1.4–7.7)
Neutrophils Relative %: 55.8 % (ref 43.0–77.0)
RBC: 3.83 Mil/uL — ABNORMAL LOW (ref 3.87–5.11)
WBC: 8.4 10*3/uL (ref 4.5–10.5)

## 2010-12-04 NOTE — Telephone Encounter (Signed)
I have left a message for the patient to come for lab work at her convenience.  She is asked to call back for any questions.

## 2010-12-04 NOTE — Telephone Encounter (Signed)
Patient had an iron infusion on 11/09/10.  She is requesting to have her iron levels checked.  Dr Juanda Chance please advise what labs she needs.

## 2010-12-04 NOTE — Telephone Encounter (Signed)
Please check CBC and Iron , TIBC, thanx DB

## 2010-12-05 ENCOUNTER — Telehealth: Payer: Self-pay | Admitting: *Deleted

## 2010-12-05 DIAGNOSIS — D509 Iron deficiency anemia, unspecified: Secondary | ICD-10-CM

## 2010-12-05 NOTE — Telephone Encounter (Signed)
Message copied by Jesse Fall on Tue Dec 05, 2010  9:14 AM ------      Message from: Lina Sar      Created: Mon Dec 04, 2010 10:41 PM       Please call pt with normal blood count but still low Iron. OK to continue oral Iron supplements daily. Please have CBC and Iron studies repeated in 3 months

## 2010-12-05 NOTE — Telephone Encounter (Signed)
Patient notified of lab results. Labs in [facility] for 03/07/11. Note to remind patient.

## 2010-12-12 NOTE — Assessment & Plan Note (Signed)
[facility]                         GASTROENTEROLOGY OFFICE NOTE   NAME:Garczynski, [name]                      MRN:          [id number]  DATE:05/16/2007                            DOB:          24-Aug-1950    GI OFFICE ADD-ON NOTE   PROBLEM:  Right upper quadrant pain.   HISTORY:  Laurin is a pleasant, 60 year old, white female, known to Dr.  Lina Sar.  Her primary physician is Dr. Ethelene Hal in [place].  She is  followed here for GERD and also has had problems with chronic  constipation.  She does have a history of coronary artery disease and is  status post stents in 2005 maintained on aspirin and Plavix.  She was  last seen here in 2006, had a colonoscopy, May 25, 2005.  She had a  tiny polyp removed from the sigmoid colon; otherwise a negative exam.  EGD was done in 2006 as well showing acute gastritis and duodenitis  without hemorrhage.  She is maintained on chronic Nexium and says she  has been taking it regularly.  Her current pain is new for her, has been  present over the past month.  She describes it as an intermittent achy  pain in the right upper quadrant, which at times radiates into her back  on the right and at times across her upper abdomen.  She has also  noticed an increase in indigestion, some vague nausea, and without  vomiting.  She has not had any fever or chills, does definitely feel  that her pain is worse postprandially.  She has not had any change in  her bowel habits, says she tends to fluctuate between loose stools and  constipation, has not noted any melena or hematochezia.  She denies any  NSAID use and does take a coated baby aspirin once daily.   CURRENT MEDICATIONS:  1. Plavix 75 daily.  2. Lopressor 50 one-half b.i.d.  3. Zocor 40 daily.  4. Altace 2.5 daily.  5. Aspirin 81 mg daily.  6. Nexium 40 daily.   ALLERGIES/INTOLERANCES:  CODEINE.   FAMILY HISTORY:  Pertinent for gallbladder disease in her  mother,  father, and sister, all status post cholecystectomies.   SOCIAL HISTORY:  The patient is a smoker and used to smoke three packs  per day and now down to one pack per day.  No regular EtOH.   PHYSICAL EXAMINATION:  GENERAL:  Well-developed white female in no acute  distress.  VITAL SIGNS:  Blood pressure 122/68, pulse in the 70s, weight is 17l.6.  HEENT:  Nontraumatic, normocephalic, EOMI, PERRLA, sclerae anicteric.  CARDIOVASCULAR:  Regular rate and rhythm with S1 and S2, no murmur, rub,  or gallop.  PULMONARY:  Clear to A&P.  ABDOMEN:  Soft, she is minimally tender in the right upper quadrant,  there is no guarding or rebound, no mass or hepatosplenomegaly, bowel  sounds are active.  RECTAL EXAM:  Not done today.   IMPRESSION:  60. A 60 year old, white female, with one-month history of intermittent      right upper quadrant pain with nausea; rule out biliary colic,  rule      out possible aspirin-induced gastropathy though she is on chronic      proton pump inhibitor.  2. History of coronary artery disease.  3. Chronic gastroesophageal reflux disease.   PLAN:  1. Check upper abdominal ultrasound.  2. Check CBC and CMET.  3. Continue Nexium 40 p.o. q.a.m.  If her ultrasound and labs are      unrevealing, she will need a CCK-HIDA scan and perhaps bump her      Nexium up to b.i.d. dosing.      Mike Gip, PA-C  Electronically Signed      Hedwig Morton. Juanda Chance, MD  Electronically Signed   AE/MedQ  DD: 05/16/2007  DT: 05/18/2007  Job #: [id number]

## 2010-12-12 NOTE — Assessment & Plan Note (Signed)
OFFICE VISIT   [name], [name]  DOB:  [birth year]                                       05/22/2010  ZOXWR#:[id number]   REASON FOR VISIT:  Disk replacement surgery.   HISTORY:  This is a 60 year old female with chronic back and buttock  pain.  She has been seen by Dr. Shon Baton, who has  recommended disk  replacement surgery at the L4-L5 level due to failure of conservative  management.  She is referred for anterior exposure.  She has known  coronary disease, as well as atherosclerotic changes in her distal  aorta.   The patient has had a coronary stent placed by Dr. Allyson Sabal in 2005 for a  95% blockage that was detected during a workup for shortness of breath.  She is also medically managed for hypertension, hypercholesterolemia.  She continues to smoke a pack of cigarettes a day.   REVIEW OF SYSTEMS:  GENERAL:  Positive for weight gain.  VASCULAR:  Positive for pain in legs when walking and lying flat.  CARDIAC:  Positive for shortness of breath with exertion.  GI:  Positive for reflux.  NEURO:  Negative.  PULMONARY:  Negative.  HEME:  Positive for anemia which has been worked up by Dr. Juanda Chance.  GU:  Negative.  ENT:  Negative.  MUSCULOSKELETAL:  Positive for edema bilaterally.  PSYCHIATRIC:  Positive for anxiety.  SKIN:  Negative.   PAST MEDICAL HISTORY:  Coronary artery disease, hypertension,  hypercholesterolemia and chronic back pain.   PAST SURGICAL HISTORY:  Partial hysterectomy via a Pfannenstiel  incision, coronary stent in 2005 and tubal ligation.   SOCIAL HISTORY:  The patient is married with one child.  She works as an  Patent examiner.  She smokes a pack of cigarettes a day.  Does not  drink alcohol.   FAMILY HISTORY:  Positive for coronary artery disease in her father who  had a CABG at age 19.   PHYSICAL EXAMINATION:  Vital signs:  Heart rate 65, blood pressure  124/81, O2 sat 99.  General:  She is well-appearing, no distress.  HEENT:  Within normal limits.  Lungs:  Clear bilaterally.  No wheezes or  rhonchi.  Cardiovascular:  Regular rate and rhythm.  No murmur.  No  carotid bruits.  She has palpable dorsalis pedis and posterior tibial  pulses bilaterally.  Abdomen:  Is soft, nontender.  She has a well-  healed lower Pfannenstiel incision.  No hernias are appreciated.  She  has an umbilical incision from her tubal ligation.  Musculoskeletal:  No  major deformities.  Neurological:  No focal deficits.  Skin:  Without  rash.   DIAGNOSTIC STUDIES:  I reviewed her CT scan which was done in February  of 2010 which shows calcific distal abdominal aorta.  The calcium  deposits are not circumferential.  Her iliac vessels are not calcified.   ASSESSMENT AND PLAN:  Preop clearance for ALIF.  I discussed with the  patient that she does have atherosclerotic changes to her distal  abdominal aorta which does make her at risk for potential complications  including embolization and dissection.  I discussed these with her and  the treatments that could potentially be required.  Overall, however, I  feel she is low risk for complication.  I would recommend proceeding  with the operation for anterior  exposure.  We will coordinate this with  Dr. Gary Fleet office to get this done at the earliest possible time.     Jorge Ny, MD  Electronically Signed   VWB/MEDQ  D:  05/22/2010  T:  05/23/2010  Job:  [id number]   cc:   Alvy Beal, MD

## 2010-12-15 NOTE — Cardiovascular Report (Signed)
[name]                         ACCOUNT NO.:  [id number]   MEDICAL RECORD NO.:  [id number]                   PATIENT TYPE:  OIB   LOCATION:  2899                                 FACILITY:  [facility]   PHYSICIAN:  Richard A. Alanda Amass, M.D.          DATE OF BIRTH:  1950-10-06   DATE OF PROCEDURE:  03/20/2004  DATE OF DISCHARGE:                              CARDIAC CATHETERIZATION   PROCEDURES:  Retrograde central aortic catheterization, selective coronary  angiography per and post IC nitroglycerin administration by Judkins  technique, LV angiogram, RAO/LAO projection, sub-selective LAMA/RMA,  abdominal aortic angiogram midstream PA projection, weight adjusted heparin,  Aggrastat bolus plus infusion, p.o. Plavix, pre-dilatation high grade  proximal symptomatic LAD stenosis, DES 3.0/13, Cordis Cypher stent, LAD.   BRIEF HISTORY:  [name] is a 60 year old white, married, mother of one  who is a greater than 2 pack a day smoker.  Strong family history of  coronary disease with father having stents and prior CABG.  Mother has  coronary disease and is a patient of Dr. Hazle Coca.  [name] was referred to Dr.  Allyson Sabal for evaluation of exertional chest pain compatible with ischemia.  [name]  underwent exercise Cardiolite on March 17, 2004, and was found to have an  early positive high risk Cardiolite with anterior ischemia.  [name] had chest  pain and ST changes during exercise.  This promptly resolved post exercise.  [name] was seen and evaluated by me as an add on in the office, and we  recommended hospitalization.  The patient declined this, but agreed to come  back to the hospital should [name] have any symptoms over the weekend and  desired outpatient diagnostic study on March 20, 2004, otherwise.  [name] was  started on beta blockers, Plavix, aspirin and statin therapy as an  outpatient empirically.  [name] was admitted to same day admission with normal  preoperative laboratory.  [name] __________  and brought to the second floor CP  lab.  The right groin was prepped and draped in the usual manner.  Xylocaine  1% was used for local anesthesia.  CFRA was __________ single anterior  puncture using an 18 thin walled needle and a __________ was inserted  without difficulty.  Diagnostic coronary angiography was done with 6-French  4 cm taper, preform Cordis coronary and pigtail catheters.  Sub-selective  LAMA and RMA were done with the right coronary catheter, and LV angiogram  was done in the RAO and LAO projection, 25 cc, 14 cc per second, 10 cc, 20  cc, 12 cc per second respectively.  Pullback pressure of the CA showed no  gradient across the aortic valve.  Abdominal aortic angiogram was done above  the level of the renal arteries at 25 cc, 20 cc per second with  visualization to the distal iliac and SFA profunda junction bilaterally.  [name] tolerated the diagnostic catheterization well, was given nitroglycerin  200 mcg during left coronary  injection with repeat injections obtained.  [name]  did have chest discomfort with chest pressure and pain with left coronary  injections relieved spontaneously.   IMPRESSION:  1. Left ventricular:  140/0, LVDP 18 mmHg.  2. CA:  140/80 mmHg.  3. There was no gradient across the aortic valve on catheter pullback.  4. The LV angiogram revealed late systolic relaxation and mild hypokinesis     in the mid anterior lateral wall with no other wall motion abnormality     and EF approximately 55% with no mitral regurgitation.  5. Abdominal angiogram demonstrated normal single renal arteries bilaterally     with patent proximal SMA and celiac access, patent IMA.  Infrarenal     abdominal aorta had no significant stenosis and mild distal     atherosclerotic disease.  The iliacs were widely patent and smooth.     Hypogastrics were intact.  There was no CIA, EIA, stenosis, and there     were intact profunda bifurcations bilaterally with good runoff.  6. The LAMA  and RAMA were widely patent.  There was no brachiocephalic or     subclavian stenosis.  7. There was 2+ calcification and a proximal LAD and 1+ of the right.  8. The main left coronary was normal.  9. The LAD course of the apex of the heart were bifurcated.  There was a 95%     eccentric, mildly segmental stenosis beyond small DX1 overlapping the     small DX2 and a small SP2.  There was another 30-40% lesion between DX2     and DX3.  That did not appear flow limiting or high grade.  There was no     thrombus evident.  The DX1 had 80% ostial stenosis and was small.  The     DX2 had no significant stenosis and was small.  The DX3 was small with no     significant stenosis.  The DX4 from the mid LAD was moderate size with no     significant stenosis.  10.      The circumflex was dominant vessel with distal vessel giving off     PDA and PLA branch.  There was a large normal OM1, moderately large     trifurcating OM2 and moderate size OM3 that bifurcated.  The circumflex     was smooth with no significant atherosclerotic disease.  11.      The right coronary was nondominant and predominantly RV branch was     of moderate size and had irregularities and 40-50% segmental narrowing     after the proximal third and before the distal RV branches.  12.      The patient's culprit lesion appears to be her high grade LAD     stenosis, and was elected to proceed with PCI in this setting.  The DX2     and SP2 arose within the lesion, but were small branches.  13.      The patient was given 300 mg of Plavix ([name] had been on Plavix 150     for one day and then 75 a day for the last two days at home and had been     on aspirin).  [name] was given weight adjusted heparin 3400 units,     monitoring ACT's, 2 mg of Versed and 2 mg of Nubain were given for     sedation in the lab.  Aggrastat double bolus plus infusion were  administered in the lab and IC nitroglycerin at 3 micro drops per minute    was begun.   The ACT was therapeutic, and the left coronary was intubated     with a JL-4 6-French Sci-Med guided catheter.  The high grade LAD     stenosis was crossed with a 0.014 inch Asahi soft wire and was positioned     in the distal LAD and stable.  The lesion was predilated with a 2.0 15     Voyager guided balloon at 8-17.  The balloon was pulled back, and using     exchange technique, a 3.0/13 Cypher stent was positioned beyond DX1 and     before DX3, deployed at 10-25 and postdilated at 14-38.  The patient had     discomfort with inflation relieved with deflations.  IC Nitroglycerin was     administered, and final injections demonstrated excellent angiographic     result with good stent placement.  Stenosis reduction from 95% to -10%     and good transition.  There was residual 40%, mildly segmental narrowing     at DX3 beyond the stent with good flow.  Dilatation system was removed.     Side arm sheath was flushed and secured to the skin with #1 silk gripped     suture to prevent migration.  The patient was transferred to the holding     area for postoperative care.  Final ACT was 283 seconds.   RECOMMENDATIONS:  Recommend smoking cessation consultation and  discontinuation of smoking.  Continued beta blocker, statin therapy and  consideration for ACE-ARB therapy.  Continued aspirin and Plavix and  __________ 2B3A inhibitor for 18 hours.  Referral for cardiac  rehabilitation.   CATHETERIZATION DIAGNOSIS:  1. Recent onset angina with early positive Cardiolite.  2. Severe strong family history of coronary disease.  3. Heavy cigarette abuse - chronic obstructive pulmonary disease - chronic     bronchitis.  4. Presumed hyperlipidemia.  5. High grade culprit lesion, proximal left anterior descending treated with     PTCA and predilatation and drug eluting stent Cypher stenting March 20, 2004.  Well preserved left ventricular function.  6. Remote hysterectomy with endometriosis.  7.  Gastroesophageal reflux disease.  8. History of eczema.                                               Richard A. Alanda Amass, M.D.    RAW/MEDQ  D:  03/20/2004  T:  03/20/2004  Job:  [id number]   cc:   [name], M.D.  Fax: [phone]   Caralyn Guile. Ethelene Hal, M.D.  [address]  [place]  [place] [address]  Fax: [phone]   [facility]   Record Room

## 2010-12-15 NOTE — Discharge Summary (Signed)
[name], [name]                         ACCOUNT NO.:  [id number]   MEDICAL RECORD NO.:  [id number]                   PATIENT TYPE:  INP   LOCATION:  4729                                 FACILITY:  [facility]   PHYSICIAN:  Nanetta Batty, M.D.                DATE OF BIRTH:  [birth year]   DATE OF ADMISSION:  04/11/2004  DATE OF DISCHARGE:  04/12/2004                                 DISCHARGE SUMMARY   ADMISSION DIAGNOSES:  1.  Chest pain.  2.  Status post coronary artery disease with recent posterior cerebral      artery and stenting of the left anterior descending March 20, 2004,      CYPHER stent to left anterior descending.  3.  Hyperlipidemia.  4.  Chronic obstructive pulmonary disease.  5.  Gastroesophageal reflux disease.   DISCHARGE DIAGNOSES:  1.  Chest pain.  2.  Status post coronary artery disease with recent posterior cerebral      artery and stenting of the left anterior descending March 20, 2004,      [place] stent to left anterior descending.  3.  Hyperlipidemia.  4.  Chronic obstructive pulmonary disease.  5.  Gastroesophageal reflux disease.   PROCEDURE:  None.   HISTORY OF PRESENT ILLNESS:  The patient is an 60 year old white female  patient with a history of atherosclerotic cardiovascular disease,  cardiovascular disease who underwent a CYPHER stent to the proximal LAD on  March 20, 2004.  She had chest heaviness and tightness in the middle of her  chest which radiated to the left.  She had this on and off since the  procedure, almost continually.  She tried nitroglycerin without relief but  stated that her pain improved.  It occurs at rest and with exertion.   MEDICATIONS ON ADMISSION:  1.  Plavix 75 mg daily.  2.  Lopressor 25 mg b.i.d.  3.  Aspirin 81 mg daily.  4.  Zocor 40 mg daily.  5.  Maalox p.r.n.   ALLERGIES:  CODEINE intolerant.   PAST MEDICAL HISTORY:  1.  Coronary artery disease.  2.  COPD.  3.  Hyperlipidemia.  4.  GERD.  She is  status post endoscopy by Dr. Lina Sar in the past.  She      was scheduled to return to Dr. Juanda Chance last year and has not.  5.  History of eczema.  6.  History of hypertrophy.  7.  History of Colon polyps.   FAMILY HISTORY:  There is history of diabetes in the family.   SOCIAL HISTORY:  Rare alcohol, tobacco two packs a day down to one pack a  day now.   REVIEW OF SYMPTOMS:  Negative.   For further history and physical, please see dictated note.   HOSPITAL COURSE:  The patient was admitted.  CK, MB and troponins were  negative.  Her pain resolved.  She was mobilized and  it was Dr. Hazle Coca  opinion that she could be discharged the following day.  The patient has a  history of gastroesophageal reflux disease as well as colon polyps.  She is  followed by Dr. Lina Sar.  She has previously done well on Nexium but had  run out and was on a over-the-counter generic which has not used recently.  Her blood pressure was down slightly in the evening going from 135/77 at 10  p.m. down to 93/59 and 95/68 at 0200 and 0600, respectively.  Follow-up  after she was mobilized the following day was normal.  She was discharged  home on her preadmission medications as above along with Nexium 40 mg daily  and Altace 2.5 mg h.s. She is to return to see Dr. Allyson Sabal on April 20, 2004, and she will undergo a Cardiolite stress test tomorrow, April 13, 2004, at 10:30.      Eber Hong, P.A.                 Nanetta Batty, M.D.    WDJ/MEDQ  D:  04/12/2004  T:  04/12/2004  Job:  [id number]

## 2011-03-05 ENCOUNTER — Telehealth: Payer: Self-pay | Admitting: *Deleted

## 2011-03-05 NOTE — Telephone Encounter (Signed)
Spoke with patient and reminded her labs are due this week.

## 2011-03-05 NOTE — Telephone Encounter (Signed)
Message copied by Daphine Deutscher on Mon Mar 05, 2011 10:52 AM ------      Message from: Daphine Deutscher      Created: Tue Dec 05, 2010  9:21 AM       Iron, IBC panel due on 03/07/11. Remind patient

## 2011-03-07 ENCOUNTER — Other Ambulatory Visit (INDEPENDENT_AMBULATORY_CARE_PROVIDER_SITE_OTHER): Payer: BC Managed Care – HMO

## 2011-03-07 DIAGNOSIS — D509 Iron deficiency anemia, unspecified: Secondary | ICD-10-CM

## 2011-03-07 LAB — CBC WITH DIFFERENTIAL/PLATELET
Basophils Absolute: 0 10*3/uL (ref 0.0–0.1)
Basophils Relative: 0.6 % (ref 0.0–3.0)
Eosinophils Absolute: 0.2 10*3/uL (ref 0.0–0.7)
Eosinophils Relative: 3.3 % (ref 0.0–5.0)
HCT: 36.5 % (ref 36.0–46.0)
Hemoglobin: 12.1 g/dL (ref 12.0–15.0)
Lymphocytes Relative: 39.8 % (ref 12.0–46.0)
Lymphs Abs: 2.8 10*3/uL (ref 0.7–4.0)
MCHC: 33.1 g/dL (ref 30.0–36.0)
MCV: 88 fl (ref 78.0–100.0)
Monocytes Absolute: 0.6 10*3/uL (ref 0.1–1.0)
Monocytes Relative: 8.7 % (ref 3.0–12.0)
Neutro Abs: 3.4 10*3/uL (ref 1.4–7.7)
Neutrophils Relative %: 47.6 % (ref 43.0–77.0)
Platelets: 287 10*3/uL (ref 150.0–400.0)
RBC: 4.15 Mil/uL (ref 3.87–5.11)
RDW: 15.1 % — ABNORMAL HIGH (ref 11.5–14.6)
WBC: 7 10*3/uL (ref 4.5–10.5)

## 2011-03-08 ENCOUNTER — Telehealth: Payer: Self-pay | Admitting: *Deleted

## 2011-03-08 DIAGNOSIS — D649 Anemia, unspecified: Secondary | ICD-10-CM

## 2011-03-08 LAB — IBC PANEL: Saturation Ratios: 5.6 % — ABNORMAL LOW (ref 20.0–50.0)

## 2011-03-08 NOTE — Telephone Encounter (Signed)
Patient given results. She wanted Dr. Juanda Chance to know she is using Pure Absorb a iron powder that is mixed in drink and it does not taste bad and it is not hard on her stomach. Labs in [facility] for 3 months. Note to remind patient

## 2011-03-08 NOTE — Telephone Encounter (Signed)
Message copied by Daphine Deutscher on Thu Mar 08, 2011  3:49 PM ------      Message from: Hart Carwin      Created: Thu Mar 08, 2011 12:59 PM       Please call pt with improved H/H, recheck in 3 months

## 2011-06-06 ENCOUNTER — Telehealth: Payer: Self-pay | Admitting: *Deleted

## 2011-06-06 NOTE — Telephone Encounter (Signed)
Left a message for patient that her lab work is due next week.

## 2011-06-06 NOTE — Telephone Encounter (Signed)
Message copied by Daphine Deutscher on Wed Jun 06, 2011  9:06 AM ------      Message from: Daphine Deutscher      Created: Thu Mar 08, 2011  3:54 PM       Call and remind patient CBC recheck is due on 11/12.(DB)

## 2011-06-13 ENCOUNTER — Other Ambulatory Visit: Payer: Self-pay | Admitting: Orthopedic Surgery

## 2011-06-13 DIAGNOSIS — M545 Low back pain: Secondary | ICD-10-CM

## 2011-06-18 ENCOUNTER — Telehealth: Payer: Self-pay | Admitting: *Deleted

## 2011-06-18 NOTE — Telephone Encounter (Signed)
Left a message for patient that she is due to have CBC drawn for Dr. Juanda Chance.

## 2011-06-19 ENCOUNTER — Ambulatory Visit
Admission: RE | Admit: 2011-06-19 | Discharge: 2011-06-19 | Disposition: A | Discharge status: Home / Self Care | Payer: BC Managed Care – HMO | Source: Ambulatory Visit | Attending: Orthopedic Surgery | Admitting: Orthopedic Surgery

## 2011-06-19 ENCOUNTER — Other Ambulatory Visit: Payer: BC Managed Care – HMO

## 2011-06-19 ENCOUNTER — Other Ambulatory Visit (INDEPENDENT_AMBULATORY_CARE_PROVIDER_SITE_OTHER): Payer: BC Managed Care – HMO

## 2011-06-19 DIAGNOSIS — M545 Low back pain: Secondary | ICD-10-CM

## 2011-06-19 DIAGNOSIS — D649 Anemia, unspecified: Secondary | ICD-10-CM

## 2011-06-19 LAB — CBC WITH DIFFERENTIAL/PLATELET
Basophils Relative: 0.5 % (ref 0.0–3.0)
Eosinophils Absolute: 0.2 10*3/uL (ref 0.0–0.7)
HCT: 33.8 % — ABNORMAL LOW (ref 36.0–46.0)
Hemoglobin: 11.2 g/dL — ABNORMAL LOW (ref 12.0–15.0)
Lymphocytes Relative: 38.5 % (ref 12.0–46.0)
MCHC: 33.2 g/dL (ref 30.0–36.0)
MCV: 82.9 fl (ref 78.0–100.0)
Neutro Abs: 4 10*3/uL (ref 1.4–7.7)
RBC: 4.07 Mil/uL (ref 3.87–5.11)

## 2011-06-20 ENCOUNTER — Telehealth: Payer: Self-pay

## 2011-06-20 DIAGNOSIS — D649 Anemia, unspecified: Secondary | ICD-10-CM

## 2011-06-20 NOTE — Telephone Encounter (Signed)
Message copied by Michele Mcalpine on Wed Jun 20, 2011  8:58 AM ------      Message from: Hart Carwin      Created: Tue Jun 19, 2011 10:17 PM       Please call pt with stable CBC, repeat in 4 months

## 2011-06-20 NOTE — Telephone Encounter (Signed)
Pt aware.

## 2011-09-20 ENCOUNTER — Other Ambulatory Visit: Payer: Self-pay | Admitting: Orthopedic Surgery

## 2011-09-20 DIAGNOSIS — M542 Cervicalgia: Secondary | ICD-10-CM

## 2011-09-20 DIAGNOSIS — M545 Low back pain: Secondary | ICD-10-CM

## 2011-09-27 ENCOUNTER — Ambulatory Visit
Admission: RE | Admit: 2011-09-27 | Discharge: 2011-09-27 | Disposition: A | Discharge status: Home / Self Care | Payer: BC Managed Care – HMO | Source: Ambulatory Visit | Attending: Orthopedic Surgery | Admitting: Orthopedic Surgery

## 2011-09-27 DIAGNOSIS — M545 Low back pain: Secondary | ICD-10-CM

## 2011-09-27 DIAGNOSIS — M542 Cervicalgia: Secondary | ICD-10-CM

## 2011-09-27 MED ORDER — GADOBENATE DIMEGLUMINE 529 MG/ML IV SOLN
15.0000 mL | Freq: Once | INTRAVENOUS | Status: AC | PRN
  Administered 2011-09-27: 15 mL via INTRAVENOUS

## 2011-10-08 ENCOUNTER — Telehealth: Payer: Self-pay | Admitting: *Deleted

## 2011-10-08 NOTE — Telephone Encounter (Signed)
Patient is not accepting calls at her number. Will try again later.

## 2011-10-08 NOTE — Telephone Encounter (Signed)
Message copied by Daphine Deutscher on Mon Oct 08, 2011 11:32 AM ------      Message from: HUNT, [place] R      Created: Wed Jun 20, 2011  9:01 AM      Regarding: CBC       Needs cbc in 4 mth

## 2011-10-09 NOTE — Telephone Encounter (Signed)
Left a message for patient to call me. 

## 2011-10-10 NOTE — Telephone Encounter (Signed)
Left a message for patient to call me. 

## 2011-10-11 ENCOUNTER — Other Ambulatory Visit (INDEPENDENT_AMBULATORY_CARE_PROVIDER_SITE_OTHER): Payer: BC Managed Care – HMO

## 2011-10-11 DIAGNOSIS — D649 Anemia, unspecified: Secondary | ICD-10-CM

## 2011-10-11 LAB — CBC WITH DIFFERENTIAL/PLATELET
Eosinophils Relative: 2.6 % (ref 0.0–5.0)
HCT: 32.6 % — ABNORMAL LOW (ref 36.0–46.0)
Hemoglobin: 10.4 g/dL — ABNORMAL LOW (ref 12.0–15.0)
Lymphs Abs: 2 10*3/uL (ref 0.7–4.0)
Monocytes Relative: 7.4 % (ref 3.0–12.0)
Platelets: 357 10*3/uL (ref 150.0–400.0)
WBC: 6.1 10*3/uL (ref 4.5–10.5)

## 2011-10-11 NOTE — Telephone Encounter (Signed)
Patient will come today for labs. 

## 2011-10-12 ENCOUNTER — Telehealth: Payer: Self-pay | Admitting: *Deleted

## 2011-10-12 NOTE — Telephone Encounter (Signed)
Patient left a message for me to call her at [phone]. Called and left message for her to call me.

## 2011-10-12 NOTE — Telephone Encounter (Signed)
Spoke with patient and gave her results and recommendations. She can do the infusion any time except Tues. Next week. Called short sty and was asked to call Monday to schedule

## 2011-10-12 NOTE — Telephone Encounter (Signed)
Notes Recorded by Hart Carwin, MD on 10/11/2011 at 5:34 PM Please call pt with her Hgb dropping again now it is down to 10.4. She has avm's. Last Iron infusion was in [place], 2012. She needs another Iron infusion in addition to oral iron supplements. Please schedule. Repeat CBC 4 weeks following Iron infusion  Left a message for patient to call me.

## 2011-10-15 ENCOUNTER — Other Ambulatory Visit: Payer: Self-pay | Admitting: *Deleted

## 2011-10-15 ENCOUNTER — Telehealth: Payer: Self-pay | Admitting: *Deleted

## 2011-10-15 NOTE — Telephone Encounter (Signed)
Patient scheduled at short stay [facility] on 10/25/11 at 9:30 AM. Left a message for patient to call me.

## 2011-10-15 NOTE — Telephone Encounter (Signed)
Spoke with [facility] short stay and scheduled patient on 10/25/11 at 9:30 AM.

## 2011-10-15 NOTE — Telephone Encounter (Signed)
Jesse Fall, RN 10/15/2011 10:59 AM Signed  Patient scheduled at short stay [facility] on 10/25/11 at 9:30 AM. Left a message for patient to call me.

## 2011-10-16 NOTE — Telephone Encounter (Signed)
Left a message for patient to call me. 

## 2011-10-16 NOTE — Telephone Encounter (Signed)
Spoke with patient and gave her appointment date and time. 

## 2011-10-25 ENCOUNTER — Encounter (HOSPITAL_COMMUNITY): Payer: Self-pay

## 2011-10-25 ENCOUNTER — Encounter (HOSPITAL_COMMUNITY)
Admission: RE | Admit: 2011-10-25 | Discharge: 2011-10-25 | Disposition: A | Discharge status: Home / Self Care | Payer: BC Managed Care – HMO | Source: Ambulatory Visit | Attending: Internal Medicine | Admitting: Internal Medicine

## 2011-10-25 DIAGNOSIS — D509 Iron deficiency anemia, unspecified: Secondary | ICD-10-CM | POA: Insufficient documentation

## 2011-10-25 HISTORY — DX: Iron deficiency anemia, unspecified: D50.9

## 2011-10-25 HISTORY — DX: Atherosclerotic heart disease of native coronary artery without angina pectoris: I25.10

## 2011-10-25 HISTORY — DX: Essential (primary) hypertension: I10

## 2011-10-25 HISTORY — DX: Pure hypercholesterolemia, unspecified: E78.00

## 2011-10-25 MED ORDER — IRON DEXTRAN 50 MG/ML IJ SOLN
750.0000 mg | Freq: Once | INTRAMUSCULAR | Status: AC
  Administered 2011-10-25: 750 mg via INTRAVENOUS
  Filled 2011-10-25: qty 15

## 2011-10-25 MED ORDER — SODIUM CHLORIDE 0.9 % IV SOLN
INTRAVENOUS | Status: DC
  Administered 2011-10-25: 250 mL via INTRAVENOUS

## 2011-10-25 NOTE — Progress Notes (Addendum)
MEDICATION RELATED CONSULT NOTE - INITIAL   Pharmacy Consult for Infex Indication: IDA  Allergies  Allergen Reactions  . Codeine     Patient Measurements: Height: 5\' 1"  (154.9 cm) Weight: 157 lb (71.215 kg) IBW/kg (Calculated) : 47.8    Labs: Hgb 10.4  Medical History: Past Medical History  Diagnosis Date  . Iron deficiency anemia   . Hypertension   . CAD (coronary artery disease)     stented in 2005  . Hypercholesterolemia     Assessment/Plan Per consult details, goal Hgb is 12. Using appropriate Iron Dextran calculation, appropriate dose for this patient would be 750mg   Per notes, it was also noted that patient has received a dose of Iron Dextran previously and therefore does not need a test dose  Berkley Harvey 10/25/2011,10:06 AM

## 2011-10-25 NOTE — Discharge Instructions (Signed)

## 2011-11-12 DIAGNOSIS — M79609 Pain in unspecified limb: Secondary | ICD-10-CM

## 2011-11-20 ENCOUNTER — Encounter (HOSPITAL_COMMUNITY): Payer: Self-pay | Admitting: Respiratory Therapy

## 2011-11-21 ENCOUNTER — Encounter (HOSPITAL_COMMUNITY): Payer: Self-pay | Admitting: Pharmacy Technician

## 2011-11-26 ENCOUNTER — Encounter (HOSPITAL_COMMUNITY): Payer: Self-pay

## 2011-11-26 ENCOUNTER — Encounter (HOSPITAL_COMMUNITY)
Admission: RE | Admit: 2011-11-26 | Discharge: 2011-11-26 | Disposition: A | Discharge status: Home / Self Care | Payer: BC Managed Care – PPO | Source: Ambulatory Visit | Attending: Anesthesiology | Admitting: Anesthesiology

## 2011-11-26 ENCOUNTER — Encounter (HOSPITAL_COMMUNITY)
Admission: RE | Admit: 2011-11-26 | Discharge: 2011-11-26 | Disposition: A | Discharge status: Home / Self Care | Payer: BC Managed Care – PPO | Source: Ambulatory Visit | Attending: Physician Assistant | Admitting: Physician Assistant

## 2011-11-26 ENCOUNTER — Encounter (HOSPITAL_COMMUNITY)
Admission: RE | Admit: 2011-11-26 | Discharge: 2011-11-26 | Disposition: A | Discharge status: Home / Self Care | Payer: BC Managed Care – PPO | Source: Ambulatory Visit | Attending: Orthopedic Surgery | Admitting: Orthopedic Surgery

## 2011-11-26 HISTORY — DX: Personal history of colon polyps, unspecified: Z86.0100

## 2011-11-26 HISTORY — DX: Anxiety disorder, unspecified: F41.9

## 2011-11-26 HISTORY — DX: Sleep apnea, unspecified: G47.30

## 2011-11-26 HISTORY — DX: Headache: R51

## 2011-11-26 HISTORY — DX: Spontaneous ecchymoses: R23.3

## 2011-11-26 HISTORY — DX: Other chronic pain: G89.29

## 2011-11-26 HISTORY — DX: Bronchitis, not specified as acute or chronic: J40

## 2011-11-26 HISTORY — DX: Gastro-esophageal reflux disease without esophagitis: K21.9

## 2011-11-26 HISTORY — DX: Unspecified osteoarthritis, unspecified site: M19.90

## 2011-11-26 HISTORY — DX: Dorsalgia, unspecified: M54.9

## 2011-11-26 HISTORY — DX: Arteriovenous malformation, site unspecified: Q27.30

## 2011-11-26 HISTORY — DX: Personal history of colonic polyps: Z86.010

## 2011-11-26 HISTORY — DX: Gastrointestinal hemorrhage, unspecified: K92.2

## 2011-11-26 HISTORY — DX: Cough: R05

## 2011-11-26 HISTORY — DX: Other skin changes: R23.8

## 2011-11-26 HISTORY — DX: Cough, unspecified: R05.9

## 2011-11-26 LAB — BASIC METABOLIC PANEL
CO2: 29 mEq/L (ref 19–32)
Chloride: 105 mEq/L (ref 96–112)
Glucose, Bld: 95 mg/dL (ref 70–99)
Potassium: 4.2 mEq/L (ref 3.5–5.1)
Sodium: 141 mEq/L (ref 135–145)

## 2011-11-26 LAB — SURGICAL PCR SCREEN: MRSA, PCR: NEGATIVE

## 2011-11-26 LAB — CBC
HCT: 39.3 % (ref 36.0–46.0)
Hemoglobin: 12.9 g/dL (ref 12.0–15.0)
MCH: 26.9 pg (ref 26.0–34.0)
MCV: 81.9 fL (ref 78.0–100.0)
RBC: 4.8 MIL/uL (ref 3.87–5.11)

## 2011-11-26 NOTE — Pre-Procedure Instructions (Signed)
20 [name]  11/26/2011   Your procedure is scheduled on:  Wed, May 1 @ 12:30 PM  Report to [facility] at 10:30 AM.  Call this number if you have problems the morning of surgery: [phone]   Remember:   Do not eat food:After Midnight.  May have clear liquids: up to 4 Hours before arrival.(until 6:30 am)  Clear liquids include soda, tea, black coffee, apple or grape juice, broth,water  Take these medicines the morning of surgery with A SIP OF WATER: Lexapro,Nexium,Pain Pill(if needed),and Metoprolol   Do not wear jewelry, make-up or nail polish.  Do not wear lotions, powders, or perfumes.   Do not shave 48 hours prior to surgery.  Do not bring valuables to the hospital.  Contacts, dentures or bridgework may not be worn into surgery.  Leave suitcase in the car. After surgery it may be brought to your room.  For patients admitted to the hospital, checkout time is 11:00 AM the day of discharge.   Patients discharged the day of surgery will not be allowed to drive home.  Special Instructions: CHG Shower Use Special Wash: 1/2 bottle night before surgery and 1/2 bottle morning of surgery.   Please read over the following fact sheets that you were given: Pain Booklet, Coughing and Deep Breathing, MRSA Information and Surgical Site Infection Prevention

## 2011-11-26 NOTE — Progress Notes (Signed)
Dr.Berry is cardiologist with last visit a week ago-to request report  Stress test done last week-to request report  Echo done in 2005 Heart cath in 2005-with results in epic EKG done last week and to be requested

## 2011-11-26 NOTE — Progress Notes (Signed)
Sleep study done in 2005-doesn't require CPAP

## 2011-11-26 NOTE — H&P (Signed)
[name] 11/26/2011 9:20 AM Location: [facility] Patient #: [id number] DOB: May 09, 1951 Married / Language: [place] / Race: White Female   History of Present Illness([name] Kristy Highman, PA-C; 11/26/2011 9:34 AM) The patient is a 61 year old female who comes in today for a preoperative History and Physical. 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 Debria Garret D. Shon Baton, Park at [facility] on Wednesday, Nov 28 2011, at 1245PM . Please see the hospital record for complete dictated history and physical.    Allergies([name], PA-C; 11/26/2011 11:22 AM) CODEINE. 02/09/2010 nausea Percocet *ANALGESICS - OPIOID*   Family History([name]; 11/26/2011 10:48 AM) Cancer. father and sister Cerebrovascular Accident. mother Chronic Obstructive Lung Disease. mother Diabetes Mellitus. father Heart Disease. father, grandfather mothers side, grandmother fathers side, grandfather fathers side and child Hypertension. mother, father and sister   Social History([name]; 11/26/2011 10:48 AM) Alcohol use. Never consumed alcohol. never consumed alcohol Children. 1 Current work status. disabled Drug/Alcohol Rehab (Currently). no Drug/Alcohol Rehab (Previously). no Exercise. Exercises rarely; does running / walking Illicit drug use. no Living situation. live alone Marital status. divorced Number of flights of stairs before winded. less than 1 Pain Contract. yes Tobacco / smoke exposure. yes outdoors only Tobacco use. Smokes < 1 pack of cigarettes per day. current every day smoker; smoke(d) 1 pack(s) per day   Medication History([name]; 11/26/2011 10:52 AM) Norco (5-325MG  Tablet, 1 Oral po tid prn pain, Taken starting 09/19/2011) Active. Neurontin (300MG  Capsule, 1 Oral po tid, Taken starting 09/19/2011) Active. Simvastatin (40MG  Tablet, Oral) Active. (qd) Ramipril  (5MG  Capsule, Oral) Active. (qd) Pantoprazole Sodium (40MG  Tablet DR, Oral) Active. (qd) Metoprolol Tartrate (50MG  Tablet, Oral) Active. (bid to total 50mg ) Isosorbide Mononitrate ER (30MG  Tablet ER 24HR, Oral) Active. (qd) Clopidogrel Bisulfate (75MG  Tablet, Oral) Active. (qd/PLAVIX; stopped plavix [id number]) Citalopram Hydrobromide (20MG  Tablet, Oral) Active. (QD) Aspir-81 (81MG  Tablet DR, Oral) Active. (3 times weekly; stopped [id number])   Pregnancy / Birth History([name]; 11/26/2011 10:48 AM) Pregnant. no   Past Surgical History([name]; 11/26/2011 10:48 AM) Colon Polyp Removal - Colonoscopy Heart Stents Hysterectomy. partial (non-cancerous) Spinal Decompression. lower back Spinal Fusion. lower back Spinal Surgery Tubal Ligation   Other Problems([name]; 11/26/2011 10:48 AM) Anxiety Disorder Chronic Pain Coronary artery disease Gastroesophageal Reflux Disease Hypercholesterolemia Skin Cancer Sleep Apnea   Review of Systems([name]; 11/26/2011 10:48 AM) General:Not Present- Chills, Fever, Night Sweats, Appetite Loss, Fatigue, Feeling sick, Weight Gain and Weight Loss. Skin:Not Present- Itching, Rash, Skin Color Changes, Ulcer, Psoriasis and Change in Hair or Nails. HEENT:Not Present- Sensitivity to light, Hearing problems, Nose Bleed and Ringing in the Ears. Neck:Not Present- Swollen Glands and Neck Mass. Respiratory:Not Present- Snoring, Chronic Cough, Bloody sputum and Dyspnea. Cardiovascular:Not Present- Shortness of Breath, Chest Pain, Swelling of Extremities, Leg Cramps and Palpitations. Gastrointestinal:Not Present- Bloody Stool, Heartburn, Abdominal Pain, Vomiting, Nausea and Incontinence of Stool. Female Genitourinary:Not Present- Blood in Urine, Menstrual Irregularities, Frequency, Incontinence and Nocturia. Musculoskeletal:Not Present- Muscle Weakness, Muscle Pain, Joint Stiffness, Joint Swelling, Joint  Pain and Back Pain. Neurological:Not Present- Tingling, Numbness, Burning, Tremor, Headaches and Dizziness. Psychiatric:Not Present- Anxiety, Depression and Memory Loss. Endocrine:Not Present- Cold Intolerance, Heat Intolerance, Excessive hunger and Excessive Thirst. Hematology:Not Present- Abnormal Bleeding, Anemia, Blood Clots and Easy Bruising.   Vitals([name]; 11/26/2011 10:54 AM) 11/26/2011 10:52 AM Weight: 157 lb Height: 61  in Body Surface Area: 1.75 m Body Mass Index: 29.66 kg/m Pulse: 56 (Regular) BP: 114/66 (Sitting, Left Arm, Standard)    Physical Exam([name] Kristy Edison Nicholson, PA-C; 11/26/2011 11:52 AM) The physical exam findings are as follows:   General General Appearance- Not in acute distress. Orientation- Oriented X3. Build & Nutrition- Well nourished and Well developed.   Integumentary General Characteristics:Surgical Scars- no surgical scar evidence of previous cervical surgery. Cervical Spine- Skin examination of the cervical spine is without deformity, skin lesions, lacerations or abrasions.   Peripheral Vascular Upper Extremity: Palpation:Radial pulse- Bilateral- 2+.   Neurologic Sensation:Upper Extremity- Bilateral- sensation is intact in the upper extremity. Reflexes:Biceps Reflex- Bilateral- 1+. Brachioradialis Reflex- Bilateral- 1+. Triceps Reflex- Bilateral- 1+. Babinski- Bilateral- Babinski not present. Hoffman's Sign- Bilateral- Hoffman's sign not present.   Musculoskeletal Spine/Ribs/Pelvis Cervical Spine : Inspection and Palpation:Tenderness- no bony tenderness to palpation and generalized tenderness about the cervical spine. bony/soft tissue palpation of the cervical spine and shoulders does not recreate their typical pain. Strength and Tone: Strength:Deltoid- Left- 4+/5. Right- 5/5. Biceps- Left- 4+/5. Right- 5/5. Triceps- Left- 4+/5. Right- 5/5. Wrist Extension- Left- 4+/5. Right-  5/5. Hand Grip- Bilateral- 5/5. Heel walk- Bilateral- able to heel walk without difficulty. Toe Walk- Bilateral- able to walk on toes without difficulty. Heel-Toe Walk- Bilateral- able to heel-toe walk without difficulty. ROM- Flexion- Moderately Decreased and painful. Extension- Moderately Decreased and painful. Left Rotation - Mildly decreased and pain-free. Right Rotation - Mildly decreased and pain-free. Pain:- neither flexion or extension is more painful than the other. Special Testing- axial compression test negative and cross chest impingement test negative. Non-Anatomic Signs- No non-anatomic signs present. Upper Extremity Range of Motion:- No truesholder pain with IR/ER of the shoulders.   Note: pain induced weakness of the left upper extremity  Assessment & Plan(Morganne Haile Kristy Alexcia Schools, PA-C; 11/26/2011 11:56 AM) Note: Unfortunately conservative measures consiting of observation, activity modification, physical therapy, oral pain medication and injection therapy have failed to alleviate her symptoms and given the ongoing nature of her pain and the significant decrease in her quality of life, she wishes to proceed wtih surgery. Risks/benefits/alternatives to surgery/expectations following the procedure have been reviewed with the patient Dr. Shon Baton.  MRI of the cervical/lumbar spine dated 09/27/11 demonstrates moderate to severe central stenosis at C5-6 and C6-7 with disc osteophyte complexes and right paracentral disc extrusions with both cranial and caudal migration of the disc.   She has been medically cleared for this procedure by Dr. Nanetta Batty. Please see the scanned document in the patients office chart. She has not yet completed his/her pre-op hospital requirements and is scheduled to do so later today. She has not yet been fitted for a cervical collar. We will fit her for this today in clinic.  All of her questions have been encouraged, addressed and answered.  Plan, at this time is to proceed with surgery as scheduled.   Signed electronically by Gwinda Maine, PA-C (11/26/2011 11:56 AM)  Darel Hong P. Woelfel 11/02/2011 10:30 AM Location: [facility] Patient #: [id number] DOB: 1951/03/30 Married / Language: [place] / Race: White Female   History of Present Monico Hoar, Park; 11/02/2011 10:36 AM) The patient is a 61 year old female who presents for a recheck of Follow-up Neck. The patient is being followed for their left-sided neck pain. Symptoms reported today include: aching (Left arm throbbing, ) and throbbing. The patient feels that they are doing poorly (Left arm severe pain ) and report their pain level to be  6 / 10. Current treatment includes: relative rest and pain medications. The following medication has been used for pain control: Hydrocodone (and Neurontin). The patient presents today following ESI (10/16/2011 Cervical ESI C7-T1 no help from the injection). The patient indicates that they have questions or concerns today regarding pain (c/o "heavy head" hard to hold up ).    Subjective Transcription([name]; 11/05/2011 11:44 AM)  She presents today for follow up of her epidural cervical injection. She states that she got maybe a little bit of relief for a very brief time but the pain has returned and is still quite severe.    Allergies([name]; 11/02/2011 10:30 AM) CODEINE. 02/09/2010 PENICILLIN. 02/09/2010   Social History([name]; 11/02/2011 10:30 AM) Alcohol use. Never consumed alcohol. Tobacco use. Smokes < 1 pack of cigarettes per day.   Medication History([name]; 11/02/2011 10:30 AM) Awanda Mink (5-325MG  Tablet, 1 Oral po tid prn pain, Taken starting 09/19/2011) Active. Neurontin (300MG  Capsule, 1 Oral po tid, Taken starting 09/19/2011) Active. Simvastatin (40MG  Tablet, Oral) Active. Ramipril (5MG  Capsule, Oral) Active. Pantoprazole Sodium (40MG   Tablet DR, Oral) Active. Metoprolol Tartrate (50MG  Tablet, Oral) Active. Isosorbide Mononitrate ER (30MG  Tablet ER 24HR, Oral) Active. Gabapentin (100MG  Capsule, Oral) Active. Clopidogrel Bisulfate (75MG  Tablet, Oral) Active. Citalopram Hydrobromide (20MG  Tablet, Oral) Active.   Vitals(Kristy D Shon Baton, Park; 11/02/2011 10:30 AM) 11/02/2011 10:30 AM Pain Level: 6/10    Plans Transcription([name]; 11/05/2011 11:44 AM)  At this point we did discuss potentially physical therapy but she is in so much pain that she does not feel like she can do the self directed exercises let alone formalized physical therapy. 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 There is some mild stenosis at C4-5 but I do believe the majority of her pain is C5-6, C6-7. She states that most of her pain is in the arm and she describes the C6 and C7 distributions. At this point in time the conservative measures I have attempted have all failed. She has expressed the desire to proceed with a surgical solution for her neck. At this point in time I would recommend a two level discectomy and fusion C5-6, C6-7. This will allow removal of the discs, stabilization of the joint and diminished motion. My hope would be that this would help alleviate most of her pain. I told her the goal of the surgery is reduction, not elimination of her pain, improved quality of life and less requirement for narcotic medications. She has expressed and understanding of the procedure. We have reviewed the risks together which include infection, bleeding, nerve damage, death, stroke, paralysis, failure to heal, ongoing or worse pain, nonunion, need for revision anterior or posterior or combined surgery, throat pain, swallowing difficulties, hoarseness in the voice, loss of bowel and bladder control. All of her questions were encouraged and addressed. Once we have her medical clearance from her  regular doctor then we will proceed with the two level anterior cervical discectomy and fusion. The patient is still smoking and in order to help decrease the potential risks of pseudoarthrosis I am going to recommend postoperative external bone stimulator. The patient has a multilevel procedure. She is a smoker and therefore she has the indications for the external bone stimulator. I have given her a soft collar as well as a lumbar support brace for comfort.    Miscellaneous Transcription([name]; 11/05/2011 11:44  AM)  Alvy Beal, Park/MMK    T: 11/05/11  D: 11/02/11      Signed electronically by Alvy Beal, Park (11/02/2011 2:44 PM)

## 2011-11-27 MED ORDER — CEFAZOLIN SODIUM-DEXTROSE 2-3 GM-% IV SOLR
2.0000 g | INTRAVENOUS | Status: AC
  Administered 2011-11-28: 2 g via INTRAVENOUS
  Filled 2011-11-27: qty 50

## 2011-11-28 ENCOUNTER — Ambulatory Visit (HOSPITAL_COMMUNITY): Payer: BC Managed Care – PPO | Admitting: Anesthesiology

## 2011-11-28 ENCOUNTER — Ambulatory Visit (HOSPITAL_COMMUNITY)
Admission: RE | Admit: 2011-11-28 | Discharge: 2011-11-29 | DRG: 864 | Disposition: A | Discharge status: Home / Self Care | Payer: BC Managed Care – PPO | Source: Ambulatory Visit | Attending: Orthopedic Surgery | Admitting: Orthopedic Surgery

## 2011-11-28 ENCOUNTER — Ambulatory Visit (HOSPITAL_COMMUNITY): Payer: BC Managed Care – PPO

## 2011-11-28 ENCOUNTER — Encounter (HOSPITAL_COMMUNITY): Payer: Self-pay | Admitting: Anesthesiology

## 2011-11-28 ENCOUNTER — Encounter (HOSPITAL_COMMUNITY)
Admission: RE | Disposition: A | Discharge status: Home / Self Care | Payer: Self-pay | Source: Ambulatory Visit | Attending: Orthopedic Surgery

## 2011-11-28 DIAGNOSIS — J449 Chronic obstructive pulmonary disease, unspecified: Secondary | ICD-10-CM | POA: Insufficient documentation

## 2011-11-28 DIAGNOSIS — G4730 Sleep apnea, unspecified: Secondary | ICD-10-CM | POA: Insufficient documentation

## 2011-11-28 DIAGNOSIS — Z01818 Encounter for other preprocedural examination: Secondary | ICD-10-CM | POA: Insufficient documentation

## 2011-11-28 DIAGNOSIS — I2510 Atherosclerotic heart disease of native coronary artery without angina pectoris: Secondary | ICD-10-CM | POA: Insufficient documentation

## 2011-11-28 DIAGNOSIS — J4489 Other specified chronic obstructive pulmonary disease: Secondary | ICD-10-CM | POA: Insufficient documentation

## 2011-11-28 DIAGNOSIS — F411 Generalized anxiety disorder: Secondary | ICD-10-CM | POA: Insufficient documentation

## 2011-11-28 DIAGNOSIS — Z85828 Personal history of other malignant neoplasm of skin: Secondary | ICD-10-CM | POA: Insufficient documentation

## 2011-11-28 DIAGNOSIS — M5412 Radiculopathy, cervical region: Secondary | ICD-10-CM

## 2011-11-28 DIAGNOSIS — F17200 Nicotine dependence, unspecified, uncomplicated: Secondary | ICD-10-CM | POA: Insufficient documentation

## 2011-11-28 DIAGNOSIS — K219 Gastro-esophageal reflux disease without esophagitis: Secondary | ICD-10-CM | POA: Insufficient documentation

## 2011-11-28 DIAGNOSIS — E7800 Pure hypercholesterolemia, unspecified: Secondary | ICD-10-CM | POA: Insufficient documentation

## 2011-11-28 DIAGNOSIS — Z7982 Long term (current) use of aspirin: Secondary | ICD-10-CM | POA: Insufficient documentation

## 2011-11-28 DIAGNOSIS — I10 Essential (primary) hypertension: Secondary | ICD-10-CM | POA: Insufficient documentation

## 2011-11-28 DIAGNOSIS — M47812 Spondylosis without myelopathy or radiculopathy, cervical region: Secondary | ICD-10-CM | POA: Insufficient documentation

## 2011-11-28 DIAGNOSIS — G8929 Other chronic pain: Secondary | ICD-10-CM | POA: Insufficient documentation

## 2011-11-28 HISTORY — PX: ANTERIOR CERVICAL DECOMP/DISCECTOMY FUSION: SHX1161

## 2011-11-28 SURGERY — ANTERIOR CERVICAL DECOMPRESSION/DISCECTOMY FUSION 2 LEVELS
Anesthesia: General | Site: Neck | Wound class: Clean

## 2011-11-28 MED ORDER — OXYCODONE HCL 5 MG PO TABS
10.0000 mg | ORAL_TABLET | ORAL | Status: DC | PRN
  Administered 2011-11-28 – 2011-11-29 (×3): 10 mg via ORAL
  Filled 2011-11-28 (×3): qty 2

## 2011-11-28 MED ORDER — PHENOL 1.4 % MT LIQD: 1.0000 | OROMUCOSAL | Status: DC | PRN

## 2011-11-28 MED ORDER — SODIUM CHLORIDE 0.9 % IJ SOLN: 3.0000 mL | Freq: Two times a day (BID) | INTRAMUSCULAR | Status: DC

## 2011-11-28 MED ORDER — MORPHINE SULFATE 4 MG/ML IJ SOLN
INTRAMUSCULAR | Status: AC
  Filled 2011-11-28: qty 1

## 2011-11-28 MED ORDER — PHENYLEPHRINE HCL 10 MG/ML IJ SOLN
INTRAMUSCULAR | Status: DC | PRN
  Administered 2011-11-28 (×2): 40 ug via INTRAVENOUS

## 2011-11-28 MED ORDER — MIDAZOLAM HCL 5 MG/5ML IJ SOLN
INTRAMUSCULAR | Status: DC | PRN
  Administered 2011-11-28: 2 mg via INTRAVENOUS

## 2011-11-28 MED ORDER — METHOCARBAMOL 100 MG/ML IJ SOLN
500.0000 mg | Freq: Four times a day (QID) | INTRAVENOUS | Status: DC | PRN
  Filled 2011-11-28: qty 5

## 2011-11-28 MED ORDER — 0.9 % SODIUM CHLORIDE (POUR BTL) OPTIME
TOPICAL | Status: DC | PRN
  Administered 2011-11-28: 1000 mL

## 2011-11-28 MED ORDER — GABAPENTIN 300 MG PO CAPS
300.0000 mg | ORAL_CAPSULE | Freq: Three times a day (TID) | ORAL | Status: DC
  Administered 2011-11-28 – 2011-11-29 (×2): 300 mg via ORAL
  Filled 2011-11-28 (×4): qty 1

## 2011-11-28 MED ORDER — LACTATED RINGERS IV SOLN: INTRAVENOUS | Status: DC

## 2011-11-28 MED ORDER — SIMVASTATIN 40 MG PO TABS
40.0000 mg | ORAL_TABLET | Freq: Every day | ORAL | Status: DC
  Filled 2011-11-28 (×2): qty 1

## 2011-11-28 MED ORDER — METHOCARBAMOL 500 MG PO TABS
500.0000 mg | ORAL_TABLET | Freq: Four times a day (QID) | ORAL | Status: DC | PRN
  Administered 2011-11-28 – 2011-11-29 (×3): 500 mg via ORAL
  Filled 2011-11-28 (×3): qty 1

## 2011-11-28 MED ORDER — EPHEDRINE SULFATE 50 MG/ML IJ SOLN
INTRAMUSCULAR | Status: DC | PRN
  Administered 2011-11-28: 10 mg via INTRAVENOUS

## 2011-11-28 MED ORDER — LIDOCAINE HCL (CARDIAC) 20 MG/ML IV SOLN
INTRAVENOUS | Status: DC | PRN
  Administered 2011-11-28: 60 mg via INTRAVENOUS

## 2011-11-28 MED ORDER — DEXTROSE 5 % IV SOLN
500.0000 mg | INTRAVENOUS | Status: AC
  Administered 2011-11-28: 500 mg via INTRAVENOUS
  Filled 2011-11-28: qty 5

## 2011-11-28 MED ORDER — HYDROMORPHONE HCL PF 1 MG/ML IJ SOLN
0.2500 mg | INTRAMUSCULAR | Status: DC | PRN
  Administered 2011-11-28 (×2): 0.5 mg via INTRAVENOUS

## 2011-11-28 MED ORDER — ACETAMINOPHEN 10 MG/ML IV SOLN
INTRAVENOUS | Status: AC
  Filled 2011-11-28: qty 100

## 2011-11-28 MED ORDER — DEXAMETHASONE SODIUM PHOSPHATE 4 MG/ML IJ SOLN
INTRAMUSCULAR | Status: DC | PRN
  Administered 2011-11-28: 10 mg via INTRAVENOUS

## 2011-11-28 MED ORDER — SODIUM CHLORIDE 0.9 % IV SOLN: 250.0000 mL | INTRAVENOUS | Status: DC

## 2011-11-28 MED ORDER — FENTANYL CITRATE 0.05 MG/ML IJ SOLN
INTRAMUSCULAR | Status: DC | PRN
  Administered 2011-11-28: 100 ug via INTRAVENOUS
  Administered 2011-11-28: 50 ug via INTRAVENOUS
  Administered 2011-11-28: 100 ug via INTRAVENOUS

## 2011-11-28 MED ORDER — LACTATED RINGERS IV SOLN
INTRAVENOUS | Status: DC
  Administered 2011-11-28: 13:00:00 via INTRAVENOUS

## 2011-11-28 MED ORDER — LACTATED RINGERS IV SOLN
INTRAVENOUS | Status: DC | PRN
  Administered 2011-11-28 (×3): via INTRAVENOUS

## 2011-11-28 MED ORDER — PROMETHAZINE HCL 25 MG/ML IJ SOLN: 6.2500 mg | INTRAMUSCULAR | Status: DC | PRN

## 2011-11-28 MED ORDER — GLYCOPYRROLATE 0.2 MG/ML IJ SOLN
INTRAMUSCULAR | Status: DC | PRN
  Administered 2011-11-28: .6 mg via INTRAVENOUS
  Administered 2011-11-28: 0.4 mg via INTRAVENOUS

## 2011-11-28 MED ORDER — ONDANSETRON HCL 4 MG/2ML IJ SOLN: 4.0000 mg | INTRAMUSCULAR | Status: DC | PRN

## 2011-11-28 MED ORDER — BUPIVACAINE-EPINEPHRINE PF 0.25-1:200000 % IJ SOLN
INTRAMUSCULAR | Status: DC | PRN
  Administered 2011-11-28: 5 mL

## 2011-11-28 MED ORDER — ZOLPIDEM TARTRATE 10 MG PO TABS: 10.0000 mg | ORAL_TABLET | Freq: Every evening | ORAL | Status: DC | PRN

## 2011-11-28 MED ORDER — HYDROMORPHONE HCL PF 1 MG/ML IJ SOLN
INTRAMUSCULAR | Status: AC
  Filled 2011-11-28: qty 1

## 2011-11-28 MED ORDER — NEOSTIGMINE METHYLSULFATE 1 MG/ML IJ SOLN
INTRAMUSCULAR | Status: DC | PRN
  Administered 2011-11-28: 3 mg via INTRAVENOUS

## 2011-11-28 MED ORDER — MEPERIDINE HCL 25 MG/ML IJ SOLN: 6.2500 mg | INTRAMUSCULAR | Status: DC | PRN

## 2011-11-28 MED ORDER — MORPHINE SULFATE 4 MG/ML IJ SOLN
1.0000 mg | INTRAMUSCULAR | Status: DC | PRN
  Administered 2011-11-28: 4 mg via INTRAVENOUS

## 2011-11-28 MED ORDER — PANTOPRAZOLE SODIUM 40 MG PO TBEC
40.0000 mg | DELAYED_RELEASE_TABLET | Freq: Every day | ORAL | Status: DC
  Administered 2011-11-29: 40 mg via ORAL
  Filled 2011-11-28: qty 1

## 2011-11-28 MED ORDER — MENTHOL 3 MG MT LOZG: 1.0000 | LOZENGE | OROMUCOSAL | Status: DC | PRN

## 2011-11-28 MED ORDER — METOPROLOL TARTRATE 25 MG PO TABS
25.0000 mg | ORAL_TABLET | Freq: Two times a day (BID) | ORAL | Status: DC
  Administered 2011-11-28 – 2011-11-29 (×2): 25 mg via ORAL
  Filled 2011-11-28 (×3): qty 1

## 2011-11-28 MED ORDER — DOCUSATE SODIUM 100 MG PO CAPS
100.0000 mg | ORAL_CAPSULE | Freq: Two times a day (BID) | ORAL | Status: DC
  Filled 2011-11-28 (×2): qty 1

## 2011-11-28 MED ORDER — ACETAMINOPHEN 10 MG/ML IV SOLN
1000.0000 mg | Freq: Four times a day (QID) | INTRAVENOUS | Status: DC
  Administered 2011-11-28 – 2011-11-29 (×2): 1000 mg via INTRAVENOUS
  Filled 2011-11-28 (×4): qty 100

## 2011-11-28 MED ORDER — RAMIPRIL 5 MG PO CAPS
5.0000 mg | ORAL_CAPSULE | Freq: Every day | ORAL | Status: DC
  Filled 2011-11-28: qty 1

## 2011-11-28 MED ORDER — THROMBIN 20000 UNITS EX KIT
PACK | CUTANEOUS | Status: DC | PRN
  Administered 2011-11-28 (×2): via TOPICAL

## 2011-11-28 MED ORDER — PROPOFOL 10 MG/ML IV EMUL
INTRAVENOUS | Status: DC | PRN
  Administered 2011-11-28: 160 mg via INTRAVENOUS

## 2011-11-28 MED ORDER — SODIUM CHLORIDE 0.9 % IJ SOLN: 3.0000 mL | INTRAMUSCULAR | Status: DC | PRN

## 2011-11-28 MED ORDER — DEXAMETHASONE SODIUM PHOSPHATE 4 MG/ML IJ SOLN
4.0000 mg | Freq: Four times a day (QID) | INTRAMUSCULAR | Status: DC
  Filled 2011-11-28 (×7): qty 1

## 2011-11-28 MED ORDER — POLYETHYLENE GLYCOL 3350 17 G PO PACK
17.0000 g | PACK | Freq: Every day | ORAL | Status: DC
  Filled 2011-11-28: qty 1

## 2011-11-28 MED ORDER — ESCITALOPRAM OXALATE 10 MG PO TABS
10.0000 mg | ORAL_TABLET | Freq: Every day | ORAL | Status: DC
  Administered 2011-11-29: 10 mg via ORAL
  Filled 2011-11-28: qty 1

## 2011-11-28 MED ORDER — ROCURONIUM BROMIDE 100 MG/10ML IV SOLN
INTRAVENOUS | Status: DC | PRN
  Administered 2011-11-28: 50 mg via INTRAVENOUS

## 2011-11-28 MED ORDER — HETASTARCH-ELECTROLYTES 6 % IV SOLN
INTRAVENOUS | Status: DC | PRN
  Administered 2011-11-28: 13:00:00 via INTRAVENOUS

## 2011-11-28 MED ORDER — CEFAZOLIN SODIUM 1-5 GM-% IV SOLN
1.0000 g | Freq: Three times a day (TID) | INTRAVENOUS | Status: AC
  Administered 2011-11-28 – 2011-11-29 (×2): 1 g via INTRAVENOUS
  Filled 2011-11-28 (×2): qty 50

## 2011-11-28 MED ORDER — DEXAMETHASONE 4 MG PO TABS
4.0000 mg | ORAL_TABLET | Freq: Four times a day (QID) | ORAL | Status: DC
  Administered 2011-11-29 (×2): 4 mg via ORAL
  Filled 2011-11-28 (×7): qty 1

## 2011-11-28 MED ORDER — ACETAMINOPHEN 10 MG/ML IV SOLN
INTRAVENOUS | Status: DC | PRN
  Administered 2011-11-28: 1000 mg via INTRAVENOUS

## 2011-11-28 MED ORDER — ONDANSETRON HCL 4 MG/2ML IJ SOLN
INTRAMUSCULAR | Status: DC | PRN
  Administered 2011-11-28: 4 mg via INTRAVENOUS

## 2011-11-28 SURGICAL SUPPLY — 68 items
BLADE SURG ROTATE 9660 (MISCELLANEOUS) IMPLANT
BUR EGG ELITE 4.0 (BURR) IMPLANT
BUR MATCHSTICK NEURO 3.0 LAGG (BURR) IMPLANT
CANISTER SUCTION 2500CC (MISCELLANEOUS) IMPLANT
CLOTH BEACON ORANGE TIMEOUT ST (SAFETY) ×2 IMPLANT
CLSR STERI-STRIP ANTIMIC 1/2X4 (GAUZE/BANDAGES/DRESSINGS) ×2 IMPLANT
COLLAR CERV LO CONTOUR FIRM DE (SOFTGOODS) IMPLANT
CONT SPEC 4OZ CLIKSEAL STRL BL (MISCELLANEOUS) ×2 IMPLANT
CORDS BIPOLAR (ELECTRODE) ×2 IMPLANT
COVER SURGICAL LIGHT HANDLE (MISCELLANEOUS) ×4 IMPLANT
CRADLE DONUT ADULT HEAD (MISCELLANEOUS) ×2 IMPLANT
DERMABOND ADVANCED (GAUZE/BANDAGES/DRESSINGS) ×1
DERMABOND ADVANCED .7 DNX12 (GAUZE/BANDAGES/DRESSINGS) ×1 IMPLANT
DRAPE C-ARM 42X72 X-RAY (DRAPES) ×2 IMPLANT
DRAPE POUCH INSTRU U-SHP 10X18 (DRAPES) ×2 IMPLANT
DRAPE SURG 17X23 STRL (DRAPES) ×2 IMPLANT
DRAPE U-SHAPE 47X51 STRL (DRAPES) ×2 IMPLANT
DRSG MEPILEX BORDER 4X4 (GAUZE/BANDAGES/DRESSINGS) ×2 IMPLANT
DRSG MEPILEX BORDER 4X8 (GAUZE/BANDAGES/DRESSINGS) ×2 IMPLANT
DURAPREP 26ML APPLICATOR (WOUND CARE) ×2 IMPLANT
ELECT COATED BLADE 2.86 ST (ELECTRODE) ×4 IMPLANT
ELECT REM PT RETURN 9FT ADLT (ELECTROSURGICAL) ×2
ELECTRODE REM PT RTRN 9FT ADLT (ELECTROSURGICAL) ×1 IMPLANT
GLOVE BIOGEL PI IND STRL 6.5 (GLOVE) ×1 IMPLANT
GLOVE BIOGEL PI IND STRL 8.5 (GLOVE) ×1 IMPLANT
GLOVE BIOGEL PI INDICATOR 6.5 (GLOVE) ×1
GLOVE BIOGEL PI INDICATOR 8.5 (GLOVE) ×1
GLOVE ECLIPSE 6.0 STRL STRAW (GLOVE) ×2 IMPLANT
GLOVE ECLIPSE 8.5 STRL (GLOVE) ×2 IMPLANT
GOWN PREVENTION PLUS XXLARGE (GOWN DISPOSABLE) ×2 IMPLANT
GOWN STRL NON-REIN LRG LVL3 (GOWN DISPOSABLE) ×4 IMPLANT
INTERLOCK LRDTC CRVCL VBR 7MM (Bone Implant) ×2 IMPLANT
KIT BASIN OR (CUSTOM PROCEDURE TRAY) ×2 IMPLANT
KIT ROOM TURNOVER OR (KITS) ×2 IMPLANT
LORDOTIC CERVICAL VBR 7MM SM (Bone Implant) ×4 IMPLANT
MANIFOLD NEPTUNE WASTE (CANNULA) ×2 IMPLANT
NEEDLE SPNL 18GX3.5 QUINCKE PK (NEEDLE) ×2 IMPLANT
NS IRRIG 1000ML POUR BTL (IV SOLUTION) ×2 IMPLANT
PACK ORTHO CERVICAL (CUSTOM PROCEDURE TRAY) ×2 IMPLANT
PACK UNIVERSAL I (CUSTOM PROCEDURE TRAY) ×2 IMPLANT
PAD ARMBOARD 7.5X6 YLW CONV (MISCELLANEOUS) ×4 IMPLANT
PENCIL BUTTON HOLSTER BLD 10FT (ELECTRODE) ×2 IMPLANT
PIN DISTRACTOR STERILE 14MM (PIN) ×6 IMPLANT
PLATE VECTRA 28MM (Plate) ×2 IMPLANT
PUTTY BONE DBX 5CC MIX (Putty) ×2 IMPLANT
SCREW 14MMX25 (Screw) ×2 IMPLANT
SCREW 4.0X14MM (Screw) ×3 IMPLANT
SCREW 4.0X16MM (Screw) ×4 IMPLANT
SCREW BN 14X4XSLF DRL VA SLF (Screw) ×3 IMPLANT
SPONGE INTESTINAL PEANUT (DISPOSABLE) ×2 IMPLANT
SPONGE LAP 4X18 X RAY DECT (DISPOSABLE) IMPLANT
SPONGE SURGIFOAM ABS GEL 100 (HEMOSTASIS) ×2 IMPLANT
STRIP CLOSURE SKIN 1/2X4 (GAUZE/BANDAGES/DRESSINGS) ×2 IMPLANT
SURGIFLO TRUKIT (HEMOSTASIS) ×2 IMPLANT
SUT MNCRL AB 3-0 PS2 18 (SUTURE) ×2 IMPLANT
SUT SILK 2 0 (SUTURE) ×1
SUT SILK 2-0 18XBRD TIE 12 (SUTURE) ×1 IMPLANT
SUT VIC AB 2-0 CT1 18 (SUTURE) ×2 IMPLANT
SUT VIC AB 3-0 54X BRD REEL (SUTURE) IMPLANT
SUT VIC AB 3-0 BRD 54 (SUTURE)
SYR BULB IRRIGATION 50ML (SYRINGE) ×2 IMPLANT
SYR CONTROL 10ML LL (SYRINGE) ×2 IMPLANT
TAPE CLOTH 4X10 WHT NS (GAUZE/BANDAGES/DRESSINGS) ×2 IMPLANT
TAPE UMBILICAL COTTON 1/8X30 (MISCELLANEOUS) ×2 IMPLANT
TOWEL OR 17X24 6PK STRL BLUE (TOWEL DISPOSABLE) ×2 IMPLANT
TOWEL OR 17X26 10 PK STRL BLUE (TOWEL DISPOSABLE) ×2 IMPLANT
TRAY FOLEY CATH 14FR (SET/KITS/TRAYS/PACK) IMPLANT
WATER STERILE IRR 1000ML POUR (IV SOLUTION) ×2 IMPLANT

## 2011-11-28 NOTE — Op Note (Signed)
[name], [name] NO.:  [id number]  MEDICAL RECORD NO.:  [id number]  LOCATION:  5031                         FACILITY:  [facility]  PHYSICIAN:  Alvy Beal, MD    DATE OF BIRTH:  1950-09-22  DATE OF PROCEDURE: DATE OF DISCHARGE:                              OPERATIVE REPORT   PREOPERATIVE DIAGNOSIS:  Cervical spondylotic radiculopathy.  POSTOPERATIVE DIAGNOSIS:  Cervical spondylotic radiculopathy.  OPERATIVE PROCEDURE:  Anterior cervical diskectomy and fusion at C5-6 and C6-7.  COMPLICATIONS:  None.  INSTRUMENTATION SYSTEM USED:  Synthes and anterior cervical Vectra plate with Titan titanium intervertebral biomechanical device, this was packed with DBX mix.  SURGEON:  Alvy Beal, MD  FIRST ASSISTANT:  Norval Gable, PA  HISTORY:  This is a very pleasant woman who presents to my office with complaints of significant neck and radicular arm pain.  After attempts at conservative management failed to alleviate her symptoms, she elected to proceed with surgery.  All appropriate risks, benefits, and alternatives were discussed with the patient and consent was obtained.  OPERATIVE NOTE:  The patient was brought to the operating room and placed supine on the operating table.  After successful induction of general anesthesia and endotracheal intubation, TEDs and SCDs were applied.  Rolled towels were placed between the shoulder blades.  The anterior cervical spine was prepped and draped in standard fashion. Time-out was then done to confirm patient, procedure, and all other pertinent important data.  Once this was done, I then used x-ray to identify the C6 vertebral body.  I then infiltrated the skin incision and then made the incision.  Left-sided transverse incision was made. Sharp dissection was carried out down to the platysma.  Platysma was sharply incised and then I continue to bluntly dissect through the deep cervical and prevertebral fascia.  I  identified the omohyoid muscle and sacrificed it for better retraction.  I then placed a retractor and swept the trachea and esophagus to the right.  I identified and protected the carotid sheath laterally on the left side with my finger. I then placed a needle into the C5-6 disk space, took an x-ray and confirmed I was at the appropriate level.  Once this level was marked, I then used bipolar electrocautery to mobilize the longus coli muscles out to the level of the uncovertebral joints from the midbody of C5 to the midbody of C7.  Once this was done, I placed the self-retaining retractor underneath the longus coli muscle at the C6-7 disk space, dropped the endotracheal cuff, expanded the retractor, and reinflated the endotracheal cuff.  Distraction pins were put into the bodies of C6 and C7 and I gently distracted the C6-7 disk space.  A 15-blade scalpel was used to perform an annulotomy.  Then using a combination of pituitary rongeurs, curettes, and Kerrison rongeurs, I removed all the disk material.  I then used a fine nerve hook and curette to release the posterior anulus from the vertebral body.  I then used a fine nerve hook to dissect through the posterior longitudinal ligament and developed a plane between the posterior longitudinal ligament and the thecal sac.  I then used a 1  mm Kerrison and resected the posterior longitudinal ligament.  I then removed the overhanging osteophytes at the uncovertebral joint to complete the decompression.  I then rasped the endplates until I had bleeding subchondral bone, and then used after trialing device, I elected to place a 7-mm lordotic small Titan titanium intervertebral graft packed with DBX mix.  I had good purchase with solid bony interface.  At this point, I removed the distraction pins from the C7 vertebral body and then repositioned into the body of C5.  I repositioned the retractors and using the same technique just used at C6-7  performed a C5- 6 diskectomy.  Again, the posterior longitudinal ligament was resected and I did remove a large right-sided hard disk osteophyte.  Again, the endplates were rasped until I had bleeding subchondral bone and I placed the same size graft packed with DBX mix.  I then removed the distraction pins, placed bone wax in the holes with the screws were in and then contoured a size 28 anterior cervical Vectra plate and secured it to the vertebral body of C5 with 16-mm screws at C6 and C7 with 14-mm screws.  All screws were locked down to the plate.  X-rays were satisfactory at this point.  I then checked to ensure the trachea and esophagus was not trapped beneath the plate.  I then irrigated it copiously with normal saline, used bipolar electrocautery to obtain hemostasis, and then returned the trachea and esophagus back to midline.  I then closed the platysma with interrupted 2-0 Vicryl sutures and 3-0 Monocryl for the skin.  Steri- Strips, dry dressing, and Aspen collar were applied.  The patient was extubated and transferred to the PACU without incident.  At the end of the case, all needle and sponge counts were correct.  There was no adverse intraoperative event.     Alvy Beal, MD     DDB/MEDQ  D:  11/28/2011  T:  11/28/2011  Job:  [id number]

## 2011-11-28 NOTE — Anesthesia Postprocedure Evaluation (Signed)
  Anesthesia Post-op Note  Patient: [name]  Procedure(s) Performed: Procedure(s) (LRB): ANTERIOR CERVICAL DECOMPRESSION/DISCECTOMY FUSION 2 LEVELS (N/A)  Patient Location: PACU  Anesthesia Type: General  Level of Consciousness: awake and alert   Airway and Oxygen Therapy: Patient Spontanous Breathing and Patient connected to nasal cannula oxygen  Post-op Pain: mild  Post-op Assessment: Post-op Vital signs reviewed and Patient's Cardiovascular Status Stable  Post-op Vital Signs: stable  Complications: No apparent anesthesia complications

## 2011-11-28 NOTE — Brief Op Note (Signed)
11/28/2011  3:40 PM  PATIENT:  [name]  61 y.o. female  PRE-OPERATIVE DIAGNOSIS:  CERVICAL SPONDYLITIC RADICULOPATHY  POST-OPERATIVE DIAGNOSIS:  CERVICAL SPONDYLITIC RADICULOPATHY  PROCEDURE:  Procedure(s) (LRB): ANTERIOR CERVICAL DECOMPRESSION/DISCECTOMY FUSION 2 LEVELS (N/A)  SURGEON:  Surgeon(s) and Role:    * Venita Lick, MD - Primary  PHYSICIAN ASSISTANT:   ASSISTANTS: none   ANESTHESIA:   general  EBL:  Total I/O In: 1750 [I.V.:1250; IV Piggyback:500] Out: 25 [Blood:25]  BLOOD ADMINISTERED:none  DRAINS: none   LOCAL MEDICATIONS USED:  MARCAINE     SPECIMEN:  No Specimen  DISPOSITION OF SPECIMEN:  N/A  COUNTS:  YES  TOURNIQUET:  * No tourniquets in log *  DICTATION: .Other Dictation: Dictation Number [id number]  PLAN OF CARE: Admit for overnight observation  PATIENT DISPOSITION:  PACU - hemodynamically stable.

## 2011-11-28 NOTE — Transfer of Care (Signed)
Immediate Anesthesia Transfer of Care Note  Patient: [name]  Procedure(s) Performed: Procedure(s) (LRB): ANTERIOR CERVICAL DECOMPRESSION/DISCECTOMY FUSION 2 LEVELS (N/A)  Patient Location: PACU  Anesthesia Type: General  Level of Consciousness: sedated and patient cooperative  Airway & Oxygen Therapy: Patient connected to nasal cannula oxygen  Post-op Assessment: Report given to PACU RN, Post -op Vital signs reviewed and stable and Patient moving all extremities X 4  Post vital signs: Reviewed and stable  Complications: No apparent anesthesia complications

## 2011-11-28 NOTE — Anesthesia Preprocedure Evaluation (Signed)
Anesthesia Evaluation  Patient identified by MRN, date of birth, ID band Patient awake    Reviewed: Allergy & Precautions, H&P , NPO status , Patient's Chart, lab work & pertinent test results, reviewed documented beta blocker date and time   Airway Mallampati: III TM Distance: >3 FB Neck ROM: Full    Dental No notable dental hx. (+) Teeth Intact   Pulmonary sleep apnea , COPD breath sounds clear to auscultation  Pulmonary exam normal       Cardiovascular hypertension, Pt. on medications and Pt. on home beta blockers + CAD Rhythm:Regular Rate:Normal     Neuro/Psych  Headaches, Anxiety    GI/Hepatic Neg liver ROS, GERD-  Medicated and Controlled,  Endo/Other  Hyperlipidemia  Renal/GU negative Renal ROS  negative genitourinary   Musculoskeletal  (+) Arthritis -,   Abdominal Normal abdominal exam  (+)  Abdomen: soft.    Peds  Hematology  (+) Blood dyscrasia, anemia ,   Anesthesia Other Findings   Reproductive/Obstetrics                           Anesthesia Physical Anesthesia Plan  ASA: III  Anesthesia Plan: General   Post-op Pain Management:    Induction: Intravenous  Airway Management Planned: Oral ETT  Additional Equipment:   Intra-op Plan:   Post-operative Plan: Extubation in OR  Informed Consent: I have reviewed the patients History and Physical, chart, labs and discussed the procedure including the risks, benefits and alternatives for the proposed anesthesia with the patient or authorized representative who has indicated his/her understanding and acceptance.   Dental advisory given  Plan Discussed with: CRNA, Anesthesiologist and Surgeon  Anesthesia Plan Comments:         Anesthesia Quick Evaluation

## 2011-11-28 NOTE — Preoperative (Signed)
Beta Blockers   Reason not to administer Beta Blockers:B blocker taken 11/28/11 @ 0830

## 2011-11-28 NOTE — Progress Notes (Signed)
Orthopedic Tech Progress Note Patient Details:  [name] 1951-01-06 [id number]  Patient ID: [name], female   DOB: 01/25/1951, 61 y.o.   MRN: [id number]   [name] 11/28/2011, 5:27 PM Trapeze bar

## 2011-11-28 NOTE — H&P (Addendum)
No change in clinical exam H+P reviewed  Patient with 2 level cervical pathology - plan on ACDF C5-7

## 2011-11-28 NOTE — Anesthesia Procedure Notes (Signed)
Procedure Name: Intubation Date/Time: 11/28/2011 12:45 PM Performed by: Rossie Muskrat L Pre-anesthesia Checklist: Patient identified, Patient being monitored, Timeout performed, Emergency Drugs available and Suction available Patient Re-evaluated:Patient Re-evaluated prior to inductionOxygen Delivery Method: Circle system utilized Preoxygenation: Pre-oxygenation with 100% oxygen Intubation Type: IV induction Ventilation: Mask ventilation without difficulty Laryngoscope Size: Miller and 2 Grade View: Grade I Tube size: 7.5 mm Number of attempts: 1 Airway Equipment and Method: Stylet Placement Confirmation: ETT inserted through vocal cords under direct vision,  breath sounds checked- equal and bilateral and positive ETCO2 Secured at: 21 cm Tube secured with: Tape Dental Injury: Teeth and Oropharynx as per pre-operative assessment

## 2011-11-29 ENCOUNTER — Encounter (HOSPITAL_COMMUNITY): Payer: Self-pay | Admitting: Orthopedic Surgery

## 2011-11-29 MED ORDER — ONDANSETRON HCL 4 MG PO TABS: 4.0000 mg | ORAL_TABLET | Freq: Three times a day (TID) | ORAL | Status: AC | PRN

## 2011-11-29 MED ORDER — HYDROCODONE-ACETAMINOPHEN 10-325 MG PO TABS: 1.0000 | ORAL_TABLET | Freq: Four times a day (QID) | ORAL | Status: AC | PRN

## 2011-11-29 MED ORDER — METHOCARBAMOL 500 MG PO TABS: 500.0000 mg | ORAL_TABLET | Freq: Three times a day (TID) | ORAL | Status: AC
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
## 2011-11-29 NOTE — Progress Notes (Signed)
Subjective: Procedure(s) (LRB): ANTERIOR CERVICAL DECOMPRESSION/DISCECTOMY FUSION 2 LEVELS (N/A) 1 Day Post-Op  Patient reports pain as mild.  Reports decreased arm pain denies incisional neck pain   Positive void Negative bowel movement Positive flatus Negative chest pain or shortness of breath  Objective: Vital signs in last 24 hours: Temp:  [97.1 F (36.2 C)-98 F (36.7 C)] 97.1 F (36.2 C) (05/02 0621) Pulse Rate:  [54-70] 67  (05/02 0621) Resp:  [12-20] 20  (05/02 0621) BP: (97-160)/(66-89) 152/79 mmHg (05/02 0621) SpO2:  [92 %-98 %] 96 % (05/02 0621)  Intake/Output from previous day: 05/01 0701 - 05/02 0700 In: 1750 [I.V.:1250; IV Piggyback:500] Out: 25 [Blood:25]   Basename 11/26/11 1257  WBC 7.8  RBC 4.80  HCT 39.3  PLT 283    Basename 11/26/11 1257  NA 141  K 4.2  CL 105  CO2 29  BUN 9  CREATININE 0.88  GLUCOSE 95  CALCIUM 9.3   No results found for this basename: LABPT:2,INR:2 in the last 72 hours  Neurologically intact ABD soft Sensation intact distally Intact pulses distally Incision: dressing C/D/I  Assessment/Plan: Patient stable  xrays per Dr. Shon Baton Continue care  Discharge later today after the patient sees Dr. Hillery Jacks, [place] 11/29/2011, 8:37 AM

## 2011-11-29 NOTE — Evaluation (Signed)
Occupational Therapy Evaluation and Discharge Patient Details Name: [name] MRN: [id number] DOB: [birth year] Today's Date: 11/29/2011 Time: 0826-0900 OT Time Calculation (min): 34 min  OT Assessment / Plan / Recommendation Clinical Impression  This 61 yo female s/p 2 level cervical fusion presents to acute OTwith all education completed and post op handout given. Will D/C from acute OT. Pt without mobility issues and made PT aware.    OT Assessment  Patient does not need any further OT services    Follow Up Recommendations  No OT follow up    Equipment Recommendations  Defer to next venue    Frequency      Precautions / Restrictions Precautions Precautions: Cervical Required Braces or Orthoses: Cervical Brace Cervical Brace: Hard collar;Applied in sitting position (per pt she can have it off in bed, eating, and showering) Restrictions Weight Bearing Restrictions: No Other Position/Activity Restrictions: No pushing, pulling, lifting       ADL  Eating/Feeding: Simulated;Independent Where Assessed - Eating/Feeding: Edge of bed Grooming: Simulated;Independent Where Assessed - Grooming: Standing at sink Upper Body Bathing: Simulated;Independent Where Assessed - Upper Body Bathing: Unsupported (sit to stand at sink) Lower Body Bathing: Simulated;Independent Where Assessed - Lower Body Bathing: Unsupported (sit to stand at sink) Upper Body Dressing: Simulated;Independent Where Assessed - Upper Body Dressing: Unsupported;Sit to stand from chair;Sit to stand from bed Lower Body Dressing: Simulated;Independent Where Assessed - Lower Body Dressing: Sit to stand from chair;Unsupported Toilet Transfer: Simulated;Independent Toilet Transfer Method: Ambulating (Bed down hallway and back to room ) Toileting - Clothing Manipulation: Simulated;Independent Where Assessed - Toileting Clothing Manipulation: Standing Toileting - Hygiene: Simulated;Independent Where Assessed -  Toileting Hygiene: Sit on 3-in-1 or toilet Tub/Shower Transfer: Not assessed Tub/Shower Transfer Method: Not assessed Equipment Used:  (cervical collar) Ambulation Related to ADLs: independent    OT Goals    Visit Information  Last OT Received On: 11/29/11 Assistance Needed: +1    Subjective Data  Patient Stated Goal: "I just do not want to fall"   Prior Functioning  Home Living Lives With:  (Ex spouse coming to help) Available Help at Discharge: Available PRN/intermittently (ex-spouse) Type of Home: House Home Adaptive Equipment: Walker - rolling Prior Function Level of Independence: Independent Able to Take Stairs?: Yes Driving: Yes Vocation: Full time employment Communication Communication: No difficulties Dominant Hand: Right    Cognition  Overall Cognitive Status: Appears within functional limits for tasks assessed/performed Arousal/Alertness: Awake/alert Orientation Level: Appears intact for tasks assessed Behavior During Session: [facility] for tasks performed    Extremity/Trunk Assessment Right Upper Extremity Assessment RUE ROM/Strength/Tone: Within functional levels Left Upper Extremity Assessment LUE ROM/Strength/Tone: Within functional levels LUE Sensation:  (does report still tingling in left hand and forearm) LUE Coordination:  (this is not impacted per pt)   Mobility Bed Mobility Bed Mobility: Rolling Right;Right Sidelying to Sit;Supine to Sit;Sitting - Scoot to Edge of Bed;Sit to Supine;Sit to Sidelying Right;Scooting to [facility] Rolling Right: 7: Independent Right Sidelying to Sit: 7: Independent Supine to Sit: 7: Independent Sitting - Scoot to Edge of Bed: 7: Independent Sit to Supine: 7: Independent Sit to Sidelying Right: 7: Independent Scooting to HOB: 7: Independent Transfers Transfers: Sit to Stand;Stand to Sit Sit to Stand: 7: Independent;With upper extremity assist;From bed Stand to Sit: 7: Independent;With upper extremity assist;To bed   Exercise     Balance    End of Session OT - End of Session Equipment Utilized During Treatment: Cervical collar (Went over how to don, doff, and  adjust Aspen Collar) Activity Tolerance: Patient tolerated treatment well Patient left: in bed;with call bell/phone within reach Nurse Communication: Mobility status (Nurse saw [place] walking in the hall)   Evette Georges [phone] 11/29/2011, 12:11 PM

## 2011-11-29 NOTE — Progress Notes (Signed)
PT Cancellation Note     Evaluation cancelled today due to pt does not need PT services per OT.  11/29/2011  [place] Bing, PT [phone] (pager)

## 2011-12-03 ENCOUNTER — Other Ambulatory Visit: Payer: Self-pay | Admitting: Orthopedic Surgery

## 2011-12-03 ENCOUNTER — Ambulatory Visit
Admission: RE | Admit: 2011-12-03 | Discharge: 2011-12-03 | Disposition: A | Discharge status: Home / Self Care | Payer: BC Managed Care – HMO | Source: Ambulatory Visit | Attending: Orthopedic Surgery | Admitting: Orthopedic Surgery

## 2011-12-03 DIAGNOSIS — R52 Pain, unspecified: Secondary | ICD-10-CM

## 2011-12-06 NOTE — Discharge Summary (Signed)
Agree with above 

## 2012-02-25 ENCOUNTER — Telehealth: Payer: Self-pay | Admitting: Internal Medicine

## 2012-02-25 DIAGNOSIS — D509 Iron deficiency anemia, unspecified: Secondary | ICD-10-CM

## 2012-02-25 NOTE — Telephone Encounter (Signed)
Please check CBC, Fe, Tibc, B12, sed rate,C-met

## 2012-02-25 NOTE — Telephone Encounter (Signed)
Left a message for patient to call me. 

## 2012-02-25 NOTE — Telephone Encounter (Signed)
Labs in EPIC. Patient will come for labs. 

## 2012-02-25 NOTE — Telephone Encounter (Signed)
Spoke with patient and she states she has been feeling tired and is asking for labs to be drawn. Last IV iron 10/25/11. Last CBC 11/26/11- hgb 12.9, hct 39.3 Please, advise.

## 2012-02-28 ENCOUNTER — Ambulatory Visit: Payer: BC Managed Care – PPO

## 2012-02-28 DIAGNOSIS — D509 Iron deficiency anemia, unspecified: Secondary | ICD-10-CM

## 2012-02-28 LAB — COMPREHENSIVE METABOLIC PANEL
ALT: 14 U/L (ref 0–35)
BUN: 7 mg/dL (ref 6–23)
CO2: 27 mEq/L (ref 19–32)
Creatinine, Ser: 0.8 mg/dL (ref 0.4–1.2)
GFR: 74.38 mL/min (ref >60.00)
Total Bilirubin: 0.4 mg/dL (ref 0.3–1.2)

## 2012-02-28 LAB — CBC WITH DIFFERENTIAL/PLATELET
Basophils Absolute: 0.1 10*3/uL (ref 0.0–0.1)
Lymphocytes Relative: 33.2 % (ref 12.0–46.0)
Monocytes Relative: 7.8 % (ref 3.0–12.0)
Neutrophils Relative %: 55.3 % (ref 43.0–77.0)
Platelets: 296 10*3/uL (ref 150.0–400.0)
RDW: 15.4 % — ABNORMAL HIGH (ref 11.5–14.6)

## 2012-02-28 LAB — IBC PANEL
Iron: 21 ug/dL — ABNORMAL LOW (ref 42–145)
Saturation Ratios: 5.1 % — ABNORMAL LOW (ref 20.0–50.0)
Transferrin: 291.8 mg/dL (ref 212.0–360.0)

## 2012-02-28 LAB — SEDIMENTATION RATE: Sed Rate: 16 mm/hr (ref 0–22)

## 2012-02-28 LAB — FERRITIN: Ferritin: 4 ng/mL — ABNORMAL LOW (ref 10.0–291.0)

## 2012-02-29 ENCOUNTER — Other Ambulatory Visit: Payer: Self-pay | Admitting: *Deleted

## 2012-02-29 DIAGNOSIS — D509 Iron deficiency anemia, unspecified: Secondary | ICD-10-CM

## 2012-03-10 ENCOUNTER — Other Ambulatory Visit (HOSPITAL_COMMUNITY): Payer: Self-pay | Admitting: *Deleted

## 2012-03-11 ENCOUNTER — Encounter (HOSPITAL_COMMUNITY)
Admission: RE | Admit: 2012-03-11 | Discharge: 2012-03-11 | Disposition: A | Discharge status: Home / Self Care | Payer: BC Managed Care – PPO | Source: Ambulatory Visit | Attending: Internal Medicine | Admitting: Internal Medicine

## 2012-03-11 DIAGNOSIS — D509 Iron deficiency anemia, unspecified: Secondary | ICD-10-CM | POA: Insufficient documentation

## 2012-03-11 MED ORDER — SODIUM CHLORIDE 0.9 % IV SOLN
1000.0000 mg | Freq: Once | INTRAVENOUS | Status: AC
  Administered 2012-03-11: 1000 mg via INTRAVENOUS
  Filled 2012-03-11: qty 20

## 2012-03-11 MED ORDER — SODIUM CHLORIDE 0.9 % IV SOLN
Freq: Once | INTRAVENOUS | Status: AC
  Administered 2012-03-11: 10:00:00 via INTRAVENOUS

## 2012-03-11 NOTE — Progress Notes (Signed)
45 minutes after pt had lunch she was up to the BR with 2 episodes of diarrhea. Pt states that this" happens to her after she eats lettuce sometimes" She had a lettuce and bacon sandwich for lunch Her iron has been infusing since 0945 and pt has had no stomach cramping or diarrhea until after lunch.

## 2012-03-11 NOTE — Progress Notes (Signed)
MEDICATION RELATED CONSULT NOTE - INITIAL   Pharmacy Consult for Iron Dextran  Labs: Hgb 10.8   A/P: Goal Hgb stated to be 14 per Md. Per RN, patient has received Infed previously and therefore will not need a test dose. Using the standard formula to calculate, the patient will receive 1000mg  Infed IV to achieve the desired Hgb  Hessie Knows, PharmD, BCPS Pager [phone] 03/11/2012 9:27 AM

## 2012-04-10 ENCOUNTER — Telehealth: Payer: Self-pay | Admitting: *Deleted

## 2012-04-10 NOTE — Telephone Encounter (Signed)
Message copied by Daphine Deutscher on Thu Apr 10, 2012  8:55 AM ------      Message from: Daphine Deutscher      Created: Fri Feb 29, 2012  2:09 PM       Call and remind CBC due for DB on 04/14/12

## 2012-04-10 NOTE — Telephone Encounter (Signed)
Spoke with and she will come for labs next week.

## 2012-04-18 ENCOUNTER — Other Ambulatory Visit (INDEPENDENT_AMBULATORY_CARE_PROVIDER_SITE_OTHER): Payer: BC Managed Care – PPO

## 2012-04-18 DIAGNOSIS — D509 Iron deficiency anemia, unspecified: Secondary | ICD-10-CM

## 2012-04-18 LAB — CBC WITH DIFFERENTIAL/PLATELET
Basophils Absolute: 0.1 10*3/uL (ref 0.0–0.1)
Basophils Relative: 0.7 % (ref 0.0–3.0)
Eosinophils Absolute: 0.1 10*3/uL (ref 0.0–0.7)
HCT: 34.8 % — ABNORMAL LOW (ref 36.0–46.0)
Hemoglobin: 11.4 g/dL — ABNORMAL LOW (ref 12.0–15.0)
Lymphs Abs: 1.9 10*3/uL (ref 0.7–4.0)
MCHC: 32.8 g/dL (ref 30.0–36.0)
MCV: 86.9 fl (ref 78.0–100.0)
Monocytes Absolute: 0.5 10*3/uL (ref 0.1–1.0)
Neutro Abs: 5.2 10*3/uL (ref 1.4–7.7)
RBC: 4.01 Mil/uL (ref 3.87–5.11)
RDW: 21.1 % — ABNORMAL HIGH (ref 11.5–14.6)

## 2012-04-21 ENCOUNTER — Other Ambulatory Visit: Payer: Self-pay | Admitting: Internal Medicine

## 2012-04-21 DIAGNOSIS — D649 Anemia, unspecified: Secondary | ICD-10-CM

## 2012-07-14 ENCOUNTER — Ambulatory Visit (INDEPENDENT_AMBULATORY_CARE_PROVIDER_SITE_OTHER): Payer: BC Managed Care – PPO

## 2012-07-14 ENCOUNTER — Other Ambulatory Visit: Payer: Self-pay | Admitting: Orthopedic Surgery

## 2012-07-14 ENCOUNTER — Telehealth: Payer: Self-pay | Admitting: *Deleted

## 2012-07-14 ENCOUNTER — Other Ambulatory Visit: Payer: BC Managed Care – PPO

## 2012-07-14 DIAGNOSIS — Z981 Arthrodesis status: Secondary | ICD-10-CM

## 2012-07-14 DIAGNOSIS — D649 Anemia, unspecified: Secondary | ICD-10-CM

## 2012-07-14 NOTE — Telephone Encounter (Signed)
Message copied by Daphine Deutscher on Mon Jul 14, 2012 10:20 AM ------      Message from: HUNT, [place] R      Created: Mon Apr 21, 2012  2:10 PM      Regarding: CBC       Pt needs CBC in 3 mths

## 2012-07-14 NOTE — Telephone Encounter (Signed)
Patient will come today for labs. 

## 2012-07-18 ENCOUNTER — Other Ambulatory Visit: Payer: Self-pay | Admitting: *Deleted

## 2012-07-18 DIAGNOSIS — D649 Anemia, unspecified: Secondary | ICD-10-CM

## 2012-07-18 LAB — CBC WITH DIFFERENTIAL/PLATELET
Basophils Absolute: 0.1 10*3/uL (ref 0.0–0.1)
Eosinophils Absolute: 0.2 10*3/uL (ref 0.0–0.7)
Lymphocytes Relative: 28.8 % (ref 12.0–46.0)
MCHC: 33.4 g/dL (ref 30.0–36.0)
Monocytes Relative: 10.1 % (ref 3.0–12.0)
Neutro Abs: 4.4 10*3/uL (ref 1.4–7.7)
Platelets: 281 10*3/uL (ref 150.0–400.0)
RDW: 14.5 % (ref 11.5–14.6)

## 2012-10-16 ENCOUNTER — Telehealth: Payer: Self-pay | Admitting: *Deleted

## 2012-10-16 NOTE — Telephone Encounter (Signed)
Spoke with patient and reminded her lab work is due for Dr. Juanda Chance.

## 2012-10-16 NOTE — Telephone Encounter (Signed)
Message copied by Daphine Deutscher on Thu Oct 16, 2012  8:25 AM ------      Message from: Daphine Deutscher      Created: Fri Jul 18, 2012  2:35 PM       Call patient and remind her CBC due on 10/20/12 DB ------

## 2012-10-27 ENCOUNTER — Encounter: Payer: Self-pay | Admitting: *Deleted

## 2012-11-06 ENCOUNTER — Other Ambulatory Visit: Payer: Self-pay | Admitting: *Deleted

## 2012-11-06 ENCOUNTER — Other Ambulatory Visit (INDEPENDENT_AMBULATORY_CARE_PROVIDER_SITE_OTHER): Payer: BC Managed Care – PPO

## 2012-11-06 DIAGNOSIS — D649 Anemia, unspecified: Secondary | ICD-10-CM

## 2012-11-06 LAB — CBC WITH DIFFERENTIAL/PLATELET
Basophils Absolute: 0.1 10*3/uL (ref 0.0–0.1)
Eosinophils Relative: 1.4 % (ref 0.0–5.0)
HCT: 40.5 % (ref 36.0–46.0)
Hemoglobin: 13.5 g/dL (ref 12.0–15.0)
Lymphocytes Relative: 24.3 % (ref 12.0–46.0)
Lymphs Abs: 2.6 10*3/uL (ref 0.7–4.0)
Monocytes Relative: 7.7 % (ref 3.0–12.0)
Neutro Abs: 7 10*3/uL (ref 1.4–7.7)
Platelets: 314 10*3/uL (ref 150.0–400.0)
RDW: 16.4 % — ABNORMAL HIGH (ref 11.5–14.6)
WBC: 10.5 10*3/uL (ref 4.5–10.5)

## 2013-04-13 ENCOUNTER — Telehealth: Payer: Self-pay | Admitting: Internal Medicine

## 2013-04-13 ENCOUNTER — Encounter: Payer: Self-pay | Admitting: *Deleted

## 2013-04-13 NOTE — Telephone Encounter (Signed)
Patient states she is having constipation, cramping and abdominal pain. She has tried various stool softeners without good results. She is on pain medications. She states she took Miralax BID last week and finally had a bowel movement. Scheduled with Dr. Juanda Chance on 04/14/13 at 3:45 PM.

## 2013-04-14 ENCOUNTER — Ambulatory Visit: Payer: BC Managed Care – PPO | Admitting: Internal Medicine

## 2013-04-15 ENCOUNTER — Telehealth: Payer: Self-pay | Admitting: Internal Medicine
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
## 2013-05-07 ENCOUNTER — Telehealth: Payer: Self-pay | Admitting: *Deleted

## 2013-05-07 NOTE — Telephone Encounter (Signed)
Patient is having back surgery at [facility] tomorrow. She will call to reschedule her OV when she is able. She will have records sent to [place].

## 2013-05-07 NOTE — Telephone Encounter (Signed)
Message copied by Daphine Deutscher on Thu May 07, 2013 10:53 AM ------      Message from: Daphine Deutscher      Created: Thu Nov 06, 2012  3:45 PM       Call and remind due for CBC on 05/11/13 DB ------

## 2013-05-19 ENCOUNTER — Ambulatory Visit: Payer: BC Managed Care – PPO | Admitting: Internal Medicine

## 2013-06-30 ENCOUNTER — Telehealth: Payer: Self-pay | Admitting: Cardiovascular Disease

## 2013-06-30 NOTE — Telephone Encounter (Signed)
Returned call and pt verified x 2.  Pt with complaints as stated below.  Pt c/o intermittent chest pressure w/ L arm pain.  Denied pain in chest.  Stated she has SOB sometimes if it gets bad.  Stated the pressure eases off when she cries.  Pt stated she thinks it is r/t stress and wanted to know if it will damage her heart or stents.  Pt denied other symptoms w/ chest pressure.  Pt informed Nada Boozer, NP will be notified as Dr. Allyson Sabal is out of the office today.  Pt verbalized understanding and agreed w/ plan.  Nada Boozer, NP notified and advised pt come in tomorrow for evaluation and EKG.  Stated pt may need stress test.  Also advised pt increase Imdur to 30 mg (whole tab).  Call to pt and informed.  Pt verbalized understanding and agreed w/ plan.  Pt wanted to see Dr. Allyson Sabal only and informed his next available appt in 12.19.14.  Pt informed he will be in the office tomorrow if NP needs to consult w/ him.  Pt agreed and appt scheduled for tomorrow at 11:40am w/ Nada Boozer, NP.

## 2013-06-30 NOTE — Telephone Encounter (Signed)
Returned call.  Left message to call back before 4pm.  

## 2013-06-30 NOTE — Telephone Encounter (Signed)
Under tremendous stress and strain due to sister in late stage liver failure.  Has been given 3 weeks to live.  Has started having having chest tightness and pain.  Thinks it is due to the stress she is under .  Wants to talk to nurse about it to make sure its not going to cause her damage.  Please call.

## 2013-07-01 ENCOUNTER — Ambulatory Visit (INDEPENDENT_AMBULATORY_CARE_PROVIDER_SITE_OTHER): Payer: BC Managed Care – PPO | Admitting: Cardiology

## 2013-07-01 ENCOUNTER — Encounter: Payer: Self-pay | Admitting: Cardiology

## 2013-07-01 ENCOUNTER — Telehealth: Payer: Self-pay | Admitting: Cardiovascular Disease

## 2013-07-01 VITALS — BP 100/60 | HR 60 | Ht 61.0 in | Wt 167.0 lb

## 2013-07-01 DIAGNOSIS — M549 Dorsalgia, unspecified: Secondary | ICD-10-CM | POA: Insufficient documentation

## 2013-07-01 DIAGNOSIS — E7800 Pure hypercholesterolemia, unspecified: Secondary | ICD-10-CM

## 2013-07-01 DIAGNOSIS — I2510 Atherosclerotic heart disease of native coronary artery without angina pectoris: Secondary | ICD-10-CM

## 2013-07-01 DIAGNOSIS — G8929 Other chronic pain: Secondary | ICD-10-CM | POA: Insufficient documentation

## 2013-07-01 DIAGNOSIS — I208 Other forms of angina pectoris: Secondary | ICD-10-CM | POA: Insufficient documentation

## 2013-07-01 DIAGNOSIS — I10 Essential (primary) hypertension: Secondary | ICD-10-CM

## 2013-07-01 DIAGNOSIS — I209 Angina pectoris, unspecified: Secondary | ICD-10-CM

## 2013-07-01 MED ORDER — ALPRAZOLAM 0.25 MG PO TABS: 0.2500 mg | ORAL_TABLET | Freq: Every evening | ORAL | Status: DC | PRN

## 2013-07-01 MED ORDER — NITROGLYCERIN 0.4 MG SL SUBL: 0.4000 mg | SUBLINGUAL_TABLET | SUBLINGUAL | Status: DC | PRN

## 2013-07-01 MED ORDER — ISOSORBIDE MONONITRATE ER 30 MG PO TB24: 30.0000 mg | ORAL_TABLET | Freq: Every day | ORAL | Status: DC

## 2013-07-01 NOTE — Patient Instructions (Signed)
If pain increases and NTG under your tongue does not help then go to ER.  Increase IMDUR to 30 mg daily.  I added Xanax to your meds at bedtime to help you sleep and help with the stress.  Follow up with Dr. Allyson Sabal after the stress test.  You will be scheduled for a lexiscan stress test to see if this is your heart causing the pressure.
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
## 2013-07-01 NOTE — Telephone Encounter (Signed)
[facility] called saying patient [name] there to pick up 3 meds from her office visit today.  She got the Xanax but the other two never showed up electronically or called in  Please call,

## 2013-07-01 NOTE — Assessment & Plan Note (Signed)
Has a nerve stimulator in place.

## 2013-07-01 NOTE — Assessment & Plan Note (Signed)
In April 2014 LDL 83,  HDL 70

## 2013-07-01 NOTE — Assessment & Plan Note (Signed)
12 episodes in one month of chest pressure, resolves with crying.  She is very stressed about her sister who is dying with liver cancer.  She does have hx of CAD and stent to LAD in 2005.  Last stress test 2013 negative for ischemia.  I am increasing her Imdur to 30 mg daily from 15mg  and have given a prescription for NTG sl.  She is on lexapro but is not sleeping.  I have also written a prescription for Xanax 0.25 mg at HS. #45 without refills.  She was instructed if this helped she would need to have them prescribed through primary care.  She will have Lexiscan myoview this week and follow up with Dr. Allyson Sabal next week.  If test positive would cath in near future.

## 2013-07-01 NOTE — Assessment & Plan Note (Signed)
controlled 

## 2013-07-01 NOTE — Telephone Encounter (Signed)
Returned call to and pt verified x 2.  Pt stated Xanax was called in to [facility] and others were sent electronically.  [facility] called [facility] and had Rxs transferred there so she has all of her prescriptions now.  RN updated pharmacy info for [facility].  Pt verbalized understanding and agreed w/ plan.  No further action taken.

## 2013-07-01 NOTE — Assessment & Plan Note (Signed)
Hx stent to LAD in 2005, neg. nuc as above.

## 2013-07-02 ENCOUNTER — Encounter: Payer: Self-pay | Admitting: Cardiology

## 2013-07-03 ENCOUNTER — Ambulatory Visit (HOSPITAL_COMMUNITY)
Admission: RE | Admit: 2013-07-03 | Discharge: 2013-07-03 | Disposition: A | Discharge status: Home / Self Care | Payer: BC Managed Care – PPO | Source: Ambulatory Visit | Attending: Internal Medicine | Admitting: Internal Medicine

## 2013-07-03 DIAGNOSIS — I208 Other forms of angina pectoris: Secondary | ICD-10-CM

## 2013-07-03 DIAGNOSIS — R0609 Other forms of dyspnea: Secondary | ICD-10-CM | POA: Insufficient documentation

## 2013-07-03 DIAGNOSIS — Z8249 Family history of ischemic heart disease and other diseases of the circulatory system: Secondary | ICD-10-CM | POA: Insufficient documentation

## 2013-07-03 DIAGNOSIS — Z9861 Coronary angioplasty status: Secondary | ICD-10-CM | POA: Insufficient documentation

## 2013-07-03 DIAGNOSIS — R079 Chest pain, unspecified: Secondary | ICD-10-CM | POA: Insufficient documentation

## 2013-07-03 DIAGNOSIS — J4489 Other specified chronic obstructive pulmonary disease: Secondary | ICD-10-CM | POA: Insufficient documentation

## 2013-07-03 DIAGNOSIS — R0989 Other specified symptoms and signs involving the circulatory and respiratory systems: Secondary | ICD-10-CM | POA: Insufficient documentation

## 2013-07-03 DIAGNOSIS — I10 Essential (primary) hypertension: Secondary | ICD-10-CM | POA: Insufficient documentation

## 2013-07-03 DIAGNOSIS — I209 Angina pectoris, unspecified: Secondary | ICD-10-CM

## 2013-07-03 DIAGNOSIS — J449 Chronic obstructive pulmonary disease, unspecified: Secondary | ICD-10-CM | POA: Insufficient documentation

## 2013-07-03 DIAGNOSIS — I2089 Other forms of angina pectoris: Secondary | ICD-10-CM

## 2013-07-03 DIAGNOSIS — I2510 Atherosclerotic heart disease of native coronary artery without angina pectoris: Secondary | ICD-10-CM | POA: Insufficient documentation

## 2013-07-03 DIAGNOSIS — R5381 Other malaise: Secondary | ICD-10-CM | POA: Insufficient documentation

## 2013-07-03 DIAGNOSIS — F17200 Nicotine dependence, unspecified, uncomplicated: Secondary | ICD-10-CM | POA: Insufficient documentation

## 2013-07-03 MED ORDER — REGADENOSON 0.4 MG/5ML IV SOLN
0.4000 mg | Freq: Once | INTRAVENOUS | Status: AC
  Administered 2013-07-03: 0.4 mg via INTRAVENOUS

## 2013-07-03 MED ORDER — TECHNETIUM TC 99M SESTAMIBI GENERIC - CARDIOLITE
10.3000 | Freq: Once | INTRAVENOUS | Status: AC | PRN
  Administered 2013-07-03: 10 via INTRAVENOUS

## 2013-07-03 MED ORDER — TECHNETIUM TC 99M SESTAMIBI GENERIC - CARDIOLITE
30.1000 | Freq: Once | INTRAVENOUS | Status: AC | PRN
  Administered 2013-07-03: 30.1 via INTRAVENOUS

## 2013-07-03 MED ORDER — AMINOPHYLLINE 25 MG/ML IV SOLN
75.0000 mg | Freq: Once | INTRAVENOUS | Status: AC
  Administered 2013-07-03: 75 mg via INTRAVENOUS

## 2013-07-03 NOTE — Procedures (Addendum)
[facility] [address] [place] [address] [place] [address] [phone]  Cardiology Nuclear Med Study  [name] is a 62 y.o. female     MRN : [id number]     DOB: 1951/07/16  Procedure Date: 07/03/2013  Nuclear Med Background Indication for Stress Test:  Evaluation for Ischemia and Stent Patency History:  COPD and CAD;STENT/PTCA--2005;CHRONIC BRONCHITIS; Cardiac Risk Factors: Family History - CAD, Hypertension, Lipids, Overweight and Smoker  Symptoms:  Chest Pain, DOE, Fatigue and SOB   Nuclear Pre-Procedure Caffeine/Decaff Intake:  7:00pm NPO After: 5:00am   IV Site: R Forearm IV 0.9% NS with Angio Cath:  22g  Chest Size (in):  N/A IV Started by: Emmit Pomfret, RN  Height: 5\' 1"  (1.549 m)  Cup Size: C  BMI:  Body mass index is 31.57 kg/(m^2). Weight:  167 lb (75.751 kg)   Tech Comments:  N/A    Nuclear Med Study 1 or 2 day study: 1 day  Stress Test Type:  Lexiscan  Order Authorizing Provider:  Nanetta Batty, MD   Resting Radionuclide: Technetium 15m Sestamibi  Resting Radionuclide Dose: 10.3 mCi   Stress Radionuclide:  Technetium 39m Sestamibi  Stress Radionuclide Dose: 30.1 mCi           Stress Protocol Rest HR: 55 Stress HR: 86  Rest BP: 159/88 Stress BP: 136/83  Exercise Time (min): n/a METS: n/a   Predicted Max HR: 159 bpm % Max HR: 55.97 bpm Rate Pressure Product: 72536  Dose of Adenosine (mg):  n/a Dose of Lexiscan: 0.4 mg  Dose of Atropine (mg): n/a Dose of Dobutamine: n/a mcg/kg/min (at max HR)  Stress Test Technologist: Esperanza Sheets, CCT Nuclear Technologist: Gonzella Lex, CNMT   Rest Procedure:  Myocardial perfusion imaging was performed at rest 45 minutes following the intravenous administration of Technetium 46m Sestamibi. Stress Procedure:  The patient received IV Lexiscan 0.4 mg over 15-seconds.  Technetium 6m Sestamibi injected at 30-seconds.  The patient experienced SOB; 75 mg of IV  Aminophylline was administered with resolution of symptoms.  There were no significant changes with Lexiscan.  Quantitative spect images were obtained after a 45 minute delay.  Transient Ischemic Dilatation (Normal <1.22):  1.03 Lung/Heart Ratio (Normal <0.45):  0.29 QGS EDV:  96 ml QGS ESV:  34 ml LV Ejection Fraction: 64%  Rest ECG: NSR - Normal EKG  Stress ECG: No significant change from baseline ECG  QPS Raw Data Images:  Normal; no motion artifact; normal heart/lung ratio. Stress Images:  There is decreased uptake in the apex. Rest Images:  There is decreased uptake in the apex. Subtraction (SDS):  No evidence of ischemia. Small fixed apical defect, previously seen.  Impression Exercise Capacity:  Lexiscan with no exercise. BP Response:  Normal blood pressure response. Clinical Symptoms:  Dyspnea, headache ECG Impression:  No significant ECG changes with Lexiscan. Comparison with Prior Nuclear Study: No significant change from previous study  Overall Impression:  Low risk stress nuclear study with small fixed apical defect which could be scar..  LV Wall Motion:  NL LV Function; NL Wall Motion; EF 64%  Chrystie Nose, MD, [facility] Board Certified in Nuclear Cardiology Attending Cardiologist [facility]  Chrystie Nose, MD  07/03/2013 11:55 AM

## 2013-07-08 ENCOUNTER — Ambulatory Visit (INDEPENDENT_AMBULATORY_CARE_PROVIDER_SITE_OTHER): Payer: BC Managed Care – PPO | Admitting: Cardiovascular Disease

## 2013-07-08 ENCOUNTER — Encounter: Payer: Self-pay | Admitting: Cardiovascular Disease

## 2013-07-08 VITALS — BP 100/62 | HR 68 | Ht 61.0 in | Wt 165.3 lb

## 2013-07-08 DIAGNOSIS — I208 Other forms of angina pectoris: Secondary | ICD-10-CM

## 2013-07-08 DIAGNOSIS — I209 Angina pectoris, unspecified: Secondary | ICD-10-CM

## 2013-07-08 DIAGNOSIS — I10 Essential (primary) hypertension: Secondary | ICD-10-CM

## 2013-07-08 DIAGNOSIS — E7800 Pure hypercholesterolemia, unspecified: Secondary | ICD-10-CM

## 2013-07-08 NOTE — Assessment & Plan Note (Signed)
Well-controlled on current medications 

## 2013-07-08 NOTE — Patient Instructions (Signed)
Your physician wants you to follow-up in: 3 months with Laura Ingold NP and 6 months with Dr Berry.  You will receive a reminder letter in the mail two months in advance. If you don't receive a letter, please call our office to schedule the follow-up appointment.  

## 2013-07-08 NOTE — Progress Notes (Signed)
07/08/2013 [name]   Mar 22, 1951  [id number]  Primary Physician Kristy Bellow, MD Primary Cardiologist: Kristy Gess MD [name]   HPI:  62 year old white female presents today after calling with complaints of chest pressure. She has a history of coronary artery disease with LAD stenting by Dr. Alanda Park in August of 2005 she had moderate disease in her nondominant right. Just has mild sleep apnea, gastroesophageal reflux disease, and continued tobacco use. Her sister has liver cancer and has extremely poor prognosis and is entering a hospice facility. The patient is having a difficult time with this grief process. She is on Lexapro.  Over the last month she's had at least 12 episodes of chest discomfort described as a pressure she just feels a bulge in her chest, she cries, it resolves itself eventually. It will radiate down the left arm at times. She has not tried nitroglycerin sublingual. She has mild shortness of breath when it occurs but no nausea no diaphoresis. She also relates she's unable to sleep at night. Her last Myoview was in April of 2013 which was negative for ischemia.  Patient also has chronic back pain and is on I nerve stimulator for pain control.  Her hypertension and hypercholesterolemia are controlled and treated. She was seen by [name] registered nurse practitioner on 07/01/13 with accelerated chest pain. She has been under a lot of stress because of of her younger sister being terminally ill.. A Myoview stress test was entirely normal. She was placed on low-dose all in all acting nitrates as well as Xanax. Her chest pain is markedly improved.    Current Outpatient Prescriptions  Medication Sig Dispense Refill  . ADVAIR DISKUS 250-50 MCG/DOSE AEPB Inhale 1 puff into the lungs daily.      Marland Kitchen ALPRAZolam (XANAX) 0.25 MG tablet Take 1 tablet (0.25 mg total) by mouth at bedtime as needed for anxiety.  30 tablet  0  . AMITIZA 8 MCG capsule  Take 8 mcg by mouth as needed.      Marland Kitchen aspirin 81 MG tablet Take 81 mg by mouth daily.      . citalopram (CELEXA) 20 MG tablet Take 20 mg by mouth daily.      . clopidogrel (PLAVIX) 75 MG tablet Take 75 mg by mouth daily.      . Gabapentin, PHN, 600 MG TABS Take by mouth. Takes 1 tablet at 2:00p and 3 tablets at 6:00p.      . HYDROcodone-acetaminophen (NORCO/VICODIN) 5-325 MG per tablet Take 1 tablet by mouth 3 (three) times daily.       . iron dextran complex (INFED) 50 MG/ML injection once. Infed IV No test dose needed since she has had one infusion. Please, give pharmacy calculated dose. Weight 160.25 Height- 5'1".      . isosorbide mononitrate (IMDUR) 30 MG 24 hr tablet Take 1 tablet (30 mg total) by mouth daily.  30 tablet  6  . metoprolol tartrate (LOPRESSOR) 25 MG tablet Take 25 mg by mouth 2 (two) times daily.      . nitroGLYCERIN (NITROSTAT) 0.4 MG SL tablet Place 1 tablet (0.4 mg total) under the tongue every 5 (five) minutes as needed for chest pain.  25 tablet  4  . pantoprazole (PROTONIX) 40 MG tablet Take 40 mg by mouth daily.      Marland Kitchen PROAIR HFA 108 (90 BASE) MCG/ACT inhaler Inhale 1 puff into the lungs as needed.      . ramipril (ALTACE)  5 MG capsule Take 5 mg by mouth daily.      . simvastatin (ZOCOR) 40 MG tablet Take 40 mg by mouth every evening.      . traMADol (ULTRAM) 50 MG tablet Take 50 mg by mouth as needed.       No current facility-administered medications for this visit.    Allergies  Allergen Reactions  . Codeine Nausea And Vomiting  . Percocet [Oxycodone-Acetaminophen] Other (See Comments)    Stroke like symptoms    History   Social History  . Marital Status: Divorced    Spouse Name: N/A    Number of Children: N/A  . Years of Education: N/A   Occupational History  . Not on file.   Social History Main Topics  . Smoking status: Current Every Day Smoker -- 1.00 packs/day for 40 years    Types: Cigarettes  . Smokeless tobacco: Never Used  . Alcohol Use:  No  . Drug Use: No  . Sexual Activity: Yes    Birth Control/ Protection: Surgical   Other Topics Concern  . Not on file   Social History Narrative  . No narrative on file     Review of Systems: General: negative for chills, fever, night sweats or weight changes.  Cardiovascular: negative for chest pain, dyspnea on exertion, edema, orthopnea, palpitations, paroxysmal nocturnal dyspnea or shortness of breath Dermatological: negative for rash Respiratory: negative for cough or wheezing Urologic: negative for hematuria Abdominal: negative for nausea, vomiting, diarrhea, bright red blood per rectum, melena, or hematemesis Neurologic: negative for visual changes, syncope, or dizziness All other systems reviewed and are otherwise negative except as noted above.    Blood pressure 100/62, pulse 68, height 5\' 1"  (1.549 m), weight 165 lb 4.8 oz (74.98 kg).  General appearance: alert and no distress Neck: no adenopathy, no carotid bruit, no JVD, supple, symmetrical, trachea midline and thyroid not enlarged, symmetric, no tenderness/mass/nodules Lungs: clear to auscultation bilaterally Heart: regular rate and rhythm, S1, S2 normal, no murmur, click, rub or gallop Extremities: extremities normal, atraumatic, no cyanosis or edema  EKG not performed today  ASSESSMENT AND PLAN:   Angina at rest Known CAD status post LAD stenting in August 2005 by Dr. Alanda Park. She is under a lot of stress because of terminal illness of her younger sister [name]. A Myoview stress test was entirely normal. She was placed on low-dose oral antibiotics. Her chest pain has markedly improved since she was here last week.  HYPERTENSION Well-controlled on current medications  HYPERCHOLESTEROLEMIA On statin therapy with recent lipid profile performed 10/29/12 revealing a glucose of 176, LDL of 83 and HDL of 70      Kristy Gess MD [facility], [facility] 07/08/2013 10:16 AM

## 2013-07-08 NOTE — Assessment & Plan Note (Signed)
Known CAD status post LAD stenting in August 2005 by Dr. Alanda Amass. She is under a lot of stress because of terminal illness of her younger sister [name]. A Myoview stress test was entirely normal. She was placed on low-dose oral antibiotics. Her chest pain has markedly improved since she was here last week.

## 2013-07-08 NOTE — Assessment & Plan Note (Signed)
On statin therapy with recent lipid profile performed 10/29/12 revealing a glucose of 176, LDL of 83 and HDL of 70

## 2013-07-13 ENCOUNTER — Encounter: Payer: Self-pay | Admitting: *Deleted

## 2013-08-05 ENCOUNTER — Encounter: Payer: Self-pay | Admitting: Internal Medicine

## 2013-08-18 ENCOUNTER — Telehealth: Payer: Self-pay | Admitting: Cardiovascular Disease

## 2013-08-18 NOTE — Telephone Encounter (Signed)
[facility] is telling her that they need the office to call in so that they can do an override to release her medication (Tantoprazole 40mg ) the prescription number is [id number] .Marland KitchenPlease call this number [phone] and say is for prior authorization .Marland Kitchen   Thanks

## 2013-08-18 NOTE — Telephone Encounter (Signed)
Message forwarded to K. Vogel, RN.  

## 2013-08-20 NOTE — Telephone Encounter (Signed)
PA form filled out and faxed into insurance for review.  Patient aware.

## 2013-08-24 NOTE — Telephone Encounter (Signed)
Fax received from [facility] that Pantoprazole was approved!! The fax said that they have informed the patient.

## 2013-10-19 ENCOUNTER — Other Ambulatory Visit: Payer: Self-pay | Admitting: Cardiovascular Disease

## 2013-10-27 ENCOUNTER — Telehealth: Payer: Self-pay | Admitting: Cardiovascular Disease

## 2013-10-27 DIAGNOSIS — E782 Mixed hyperlipidemia: Secondary | ICD-10-CM

## 2013-10-27 DIAGNOSIS — Z79899 Other long term (current) drug therapy: Secondary | ICD-10-CM

## 2013-10-27 NOTE — Telephone Encounter (Signed)
Please mail out a lab order for [name] before her appt on 01/05/2014.. Thanks

## 2013-10-27 NOTE — Telephone Encounter (Signed)
Mailed lab slip CMP LIPID

## 2013-11-18 ENCOUNTER — Telehealth: Payer: Self-pay | Admitting: Cardiovascular Disease

## 2013-11-18 ENCOUNTER — Other Ambulatory Visit: Payer: Self-pay | Admitting: Cardiovascular Disease

## 2013-11-18 MED ORDER — CLOPIDOGREL BISULFATE 75 MG PO TABS: 75.0000 mg | ORAL_TABLET | Freq: Every day | ORAL | Status: DC

## 2013-11-18 MED ORDER — ISOSORBIDE MONONITRATE ER 30 MG PO TB24: 30.0000 mg | ORAL_TABLET | Freq: Every day | ORAL | Status: DC

## 2013-11-18 MED ORDER — RAMIPRIL 5 MG PO CAPS: 5.0000 mg | ORAL_CAPSULE | Freq: Every day | ORAL | Status: DC

## 2013-11-18 MED ORDER — PANTOPRAZOLE SODIUM 40 MG PO TBEC: 40.0000 mg | DELAYED_RELEASE_TABLET | Freq: Every day | ORAL | Status: DC

## 2013-11-18 MED ORDER — METOPROLOL TARTRATE 25 MG PO TABS: 25.0000 mg | ORAL_TABLET | Freq: Two times a day (BID) | ORAL | Status: DC

## 2013-11-18 NOTE — Telephone Encounter (Signed)
Pt called and said be sure to have [name] call her.She said she can not get an appt until June. She will run out of her medicine in April. She said the scheduler would not listen to her.

## 2013-11-18 NOTE — Telephone Encounter (Signed)
Returned call and pt verified x 2.  Pt stated Dalene Seltzer would not schedule her an appt for April, which is her anniversary appt and she will be out of her medicines this month.  Pt informed refills can be sent for another 90-days and she keep her appt in June.  RN reviewed meds and refills sent for all requested except Celexa.  Pt informed that is not a heart medication and this RN does not refill meds not related to the heart and will send to Curt Bears, RN to discuss w/ Dr. Gwenlyn Found.  Pt verbalized understanding and agreed w/ plan.  Message forwarded to Curt Bears, RN to discuss w/ Dr. Gwenlyn Found to review Celexa refill and send to [facility] if approved.

## 2013-11-19 NOTE — Telephone Encounter (Signed)
Rx was sent to pharmacy electronically. 

## 2013-11-24 MED ORDER — CITALOPRAM HYDROBROMIDE 20 MG PO TABS: 20.0000 mg | ORAL_TABLET | Freq: Every day | ORAL | Status: DC

## 2013-11-24 NOTE — Telephone Encounter (Signed)
Patient notified. RX sent in for Celexa

## 2013-11-24 NOTE — Telephone Encounter (Signed)
Per Dr Alberteen Spindle to refill celexa

## 2013-12-24 ENCOUNTER — Other Ambulatory Visit: Payer: Self-pay | Admitting: Cardiovascular Disease

## 2013-12-24 NOTE — Telephone Encounter (Signed)
Rx refill sent to patient pharmacy   

## 2013-12-28 ENCOUNTER — Encounter: Payer: Self-pay | Admitting: Physician Assistant

## 2014-01-05 ENCOUNTER — Encounter: Payer: Self-pay | Admitting: Cardiovascular Disease

## 2014-01-05 ENCOUNTER — Ambulatory Visit: Payer: BC Managed Care – PPO | Admitting: Physician Assistant

## 2014-01-05 ENCOUNTER — Ambulatory Visit (INDEPENDENT_AMBULATORY_CARE_PROVIDER_SITE_OTHER): Payer: BC Managed Care – PPO | Admitting: Cardiovascular Disease

## 2014-01-05 VITALS — BP 128/64 | HR 60 | Ht 61.0 in | Wt 165.2 lb

## 2014-01-05 DIAGNOSIS — I10 Essential (primary) hypertension: Secondary | ICD-10-CM

## 2014-01-05 DIAGNOSIS — E7800 Pure hypercholesterolemia, unspecified: Secondary | ICD-10-CM

## 2014-01-05 DIAGNOSIS — I2510 Atherosclerotic heart disease of native coronary artery without angina pectoris: Secondary | ICD-10-CM

## 2014-01-05 DIAGNOSIS — I209 Angina pectoris, unspecified: Secondary | ICD-10-CM

## 2014-01-05 DIAGNOSIS — I208 Other forms of angina pectoris: Secondary | ICD-10-CM

## 2014-01-05 DIAGNOSIS — I2089 Other forms of angina pectoris: Secondary | ICD-10-CM

## 2014-01-05 MED ORDER — CITALOPRAM HYDROBROMIDE 20 MG PO TABS: 20.0000 mg | ORAL_TABLET | Freq: Every day | ORAL | Status: DC

## 2014-01-05 MED ORDER — CLOPIDOGREL BISULFATE 75 MG PO TABS: 75.0000 mg | ORAL_TABLET | Freq: Every day | ORAL | Status: DC

## 2014-01-05 MED ORDER — PANTOPRAZOLE SODIUM 40 MG PO TBEC: 40.0000 mg | DELAYED_RELEASE_TABLET | Freq: Every day | ORAL | Status: DC

## 2014-01-05 MED ORDER — RAMIPRIL 5 MG PO CAPS: 5.0000 mg | ORAL_CAPSULE | Freq: Every day | ORAL | Status: DC

## 2014-01-05 MED ORDER — ISOSORBIDE MONONITRATE ER 30 MG PO TB24: 30.0000 mg | ORAL_TABLET | Freq: Every day | ORAL | Status: DC

## 2014-01-05 MED ORDER — SIMVASTATIN 40 MG PO TABS: 40.0000 mg | ORAL_TABLET | Freq: Every day | ORAL | Status: DC

## 2014-01-05 MED ORDER — NITROGLYCERIN 0.4 MG SL SUBL: 0.4000 mg | SUBLINGUAL_TABLET | SUBLINGUAL | Status: DC | PRN

## 2014-01-05 NOTE — Patient Instructions (Signed)
Your physician recommends that you schedule a follow-up appointment in: 1 year  

## 2014-01-05 NOTE — Assessment & Plan Note (Signed)
Controlled on current medications 

## 2014-01-05 NOTE — Assessment & Plan Note (Signed)
History of coronary artery disease status post LAD intervention back in 2005. She had a recent Myoview stress test that was normal 07/01/13. Her chest pain resolved and was attributed to excess stress in her life because of the loss of her younger sister.

## 2014-01-05 NOTE — Progress Notes (Signed)
01/05/2014 [name]   1951/01/09  [id number]  Primary Physician Kateri Mc, MD Primary Cardiologist: Lorretta Harp MD Renae Gloss   HPI:  63 year old white female I last saw 6 months ago. . She has a history of coronary artery disease with LAD stenting by Dr. Rollene Fare in August of 2005 she had moderate disease in her nondominant right. Just has mild sleep apnea, gastroesophageal reflux disease, and continued tobacco use. Her sister has liver cancer and has extremely poor prognosis and is entering a hospice facility. The patient is having a difficult time with this grief process. She is on Lexapro.  Over the last month she's had at least 12 episodes of chest discomfort described as a pressure she just feels a bulge in her chest, she cries, it resolves itself eventually. It will radiate down the left arm at times. She has not tried nitroglycerin sublingual. She has mild shortness of breath when it occurs but no nausea no diaphoresis. She also relates she's unable to sleep at night. She had a Myoview performed April of 2013 which was negative for ischemia.  Patient also has chronic back pain and is on I nerve stimulator for pain control.  Her hypertension and hypercholesterolemia are controlled and treated.  She was seen by Cecilie Kicks registered nurse practitioner on 07/01/13 with accelerated chest pain. She had been under a lot of stress because of of her younger sister being terminally ill.. A Myoview stress test was entirely normal. She was placed on low-dose all in all acting nitrates as well as Xanax. Her chest pain is markedly improved.since I saw her 6 months ago she has remained pain-free.    Current Outpatient Prescriptions  Medication Sig Dispense Refill  . ADVAIR DISKUS 250-50 MCG/DOSE AEPB Inhale 1 puff into the lungs daily.      Marland Kitchen ALPRAZolam (XANAX) 0.25 MG tablet Take 1 tablet (0.25 mg total) by mouth at bedtime as needed for anxiety.  30 tablet  0  .  AMITIZA 8 MCG capsule Take 8 mcg by mouth as needed.      Marland Kitchen aspirin 81 MG tablet Take 81 mg by mouth. 3 times a week      . Cholecalciferol (VITAMIN D-1000 MAX ST) 1000 UNITS tablet Take 1 tablet by mouth daily.      . citalopram (CELEXA) 20 MG tablet Take 1 tablet (20 mg total) by mouth daily.  90 tablet  1  . clopidogrel (PLAVIX) 75 MG tablet Take 1 tablet (75 mg total) by mouth daily.  90 tablet  0  . ferrous fumarate (HEMOCYTE - 106 MG FE) 325 (106 FE) MG TABS tablet Take 1 tablet by mouth daily.      . Gabapentin, PHN, 600 MG TABS Take by mouth. Takes 1 tablet at 2:00p and 3 tablets at 6:00p.      . HYDROcodone-acetaminophen (NORCO/VICODIN) 5-325 MG per tablet Take 1 tablet by mouth 3 (three) times daily.       . isosorbide mononitrate (IMDUR) 30 MG 24 hr tablet Take 1 tablet (30 mg total) by mouth daily.  90 tablet  0  . metoprolol tartrate (LOPRESSOR) 25 MG tablet Take 1 tablet (25 mg total) by mouth 2 (two) times daily.  180 tablet  0  . nitroGLYCERIN (NITROSTAT) 0.4 MG SL tablet Place 1 tablet (0.4 mg total) under the tongue every 5 (five) minutes as needed for chest pain.  25 tablet  4  . pantoprazole (PROTONIX) 40 MG tablet TAKE  1 TABLET DAILY  90 tablet  1  . PROAIR HFA 108 (90 BASE) MCG/ACT inhaler Inhale 1 puff into the lungs as needed.      . ramipril (ALTACE) 5 MG capsule Take 1 capsule (5 mg total) by mouth daily.  90 capsule  0  . simvastatin (ZOCOR) 40 MG tablet TAKE 1 TABLET DAILY AT BEDTIME  90 tablet  0  . traMADol (ULTRAM) 50 MG tablet Take 50 mg by mouth as needed.      . vitamin B-12 (CYANOCOBALAMIN) 1000 MCG tablet Take 1 tablet by mouth daily.       No current facility-administered medications for this visit.    Allergies  Allergen Reactions  . Codeine Nausea And Vomiting  . Percocet [Oxycodone-Acetaminophen] Other (See Comments)    Stroke like symptoms    History   Social History  . Marital Status: Divorced    Spouse Name: N/A    Number of Children: N/A    . Years of Education: N/A   Occupational History  . Not on file.   Social History Main Topics  . Smoking status: Current Every Day Smoker -- 1.00 packs/day for 40 years    Types: Cigarettes  . Smokeless tobacco: Never Used  . Alcohol Use: No  . Drug Use: No  . Sexual Activity: Yes    Birth Control/ Protection: Surgical   Other Topics Concern  . Not on file   Social History Narrative  . No narrative on file     Review of Systems: General: negative for chills, fever, night sweats or weight changes.  Cardiovascular: negative for chest pain, dyspnea on exertion, edema, orthopnea, palpitations, paroxysmal nocturnal dyspnea or shortness of breath Dermatological: negative for rash Respiratory: negative for cough or wheezing Urologic: negative for hematuria Abdominal: negative for nausea, vomiting, diarrhea, bright red blood per rectum, melena, or hematemesis Neurologic: negative for visual changes, syncope, or dizziness All other systems reviewed and are otherwise negative except as noted above.    Blood pressure 128/64, pulse 60, height 5\' 1"  (1.549 m), weight 165 lb 3.2 oz (74.934 kg).  General appearance: alert and no distress Neck: no adenopathy, no carotid bruit, no JVD, supple, symmetrical, trachea midline and thyroid not enlarged, symmetric, no tenderness/mass/nodules Lungs: clear to auscultation bilaterally Heart: regular rate and rhythm, S1, S2 normal, no murmur, click, rub or gallop Extremities: extremities normal, atraumatic, no cyanosis or edema  EKG normal sinus rhythm at 60 without ST or T wave changes  ASSESSMENT AND PLAN:   HYPERTENSION Controlled on current medications  HYPERCHOLESTEROLEMIA On statin therapy followed by her PCP  CAD History of coronary artery disease status post LAD intervention back in 2005. She had a recent Myoview stress test that was normal 07/01/13. Her chest pain resolved and was attributed to excess stress in her life because of  the loss of her younger sister.      Lorretta Harp MD FACP,FACC,FAHA, [facility] 01/05/2014 12:23 PM

## 2014-01-05 NOTE — Assessment & Plan Note (Signed)
On statin therapy followed by her PCP 

## 2014-01-18 ENCOUNTER — Encounter: Payer: Self-pay | Admitting: Cardiovascular Disease

## 2014-02-11 ENCOUNTER — Encounter: Payer: Self-pay | Admitting: Internal Medicine

## 2014-03-23 ENCOUNTER — Encounter: Payer: Self-pay | Admitting: Internal Medicine

## 2014-04-22 ENCOUNTER — Telehealth: Payer: Self-pay | Admitting: Cardiovascular Disease

## 2014-04-22 ENCOUNTER — Other Ambulatory Visit: Payer: Self-pay | Admitting: *Deleted

## 2014-04-22 MED ORDER — METOPROLOL TARTRATE 25 MG PO TABS: 25.0000 mg | ORAL_TABLET | Freq: Two times a day (BID) | ORAL | Status: DC

## 2014-04-22 NOTE — Telephone Encounter (Signed)
Pt called and said [facility] will be faxing her Metoprolol in a few minutes.The manufacturer needs to be [employer].

## 2014-04-22 NOTE — Telephone Encounter (Signed)
Spoke with pt, aware we received fax and have sent it back to [facility].

## 2014-04-27 ENCOUNTER — Telehealth: Payer: Self-pay | Admitting: Cardiovascular Disease

## 2014-04-27 MED ORDER — METOPROLOL TARTRATE 25 MG PO TABS: 25.0000 mg | ORAL_TABLET | Freq: Two times a day (BID) | ORAL | Status: DC

## 2014-04-27 NOTE — Telephone Encounter (Signed)
Rx was sent to pharmacy electronically. 

## 2014-04-27 NOTE — Telephone Encounter (Signed)
Pt says she still have not received her Metoprolol from [facility]. Please call this in today to them,she is getting real low.Please be sure to mention the manufacturer [employer].

## 2014-06-24 ENCOUNTER — Other Ambulatory Visit: Payer: Self-pay | Admitting: Cardiovascular Disease

## 2014-11-18 ENCOUNTER — Telehealth: Payer: Self-pay | Admitting: Cardiovascular Disease

## 2014-11-18 DIAGNOSIS — I208 Other forms of angina pectoris: Secondary | ICD-10-CM

## 2014-11-18 DIAGNOSIS — E7800 Pure hypercholesterolemia, unspecified: Secondary | ICD-10-CM

## 2014-11-18 NOTE — Telephone Encounter (Signed)
Returned call to patient.Fasting lab orders mailed to patient.

## 2014-11-18 NOTE — Telephone Encounter (Signed)
Pt called in stating that she would like her lab orders mailed to her house so that she can have them done in [place].  Thanks

## 2015-01-04 ENCOUNTER — Encounter: Payer: Self-pay | Admitting: *Deleted

## 2015-01-19 LAB — CBC WITH DIFFERENTIAL/PLATELET
Basophils Absolute: 0.1 10*3/uL (ref 0.0–0.1)
Basophils Relative: 1 % (ref 0–1)
EOS ABS: 0.2 10*3/uL (ref 0.0–0.7)
EOS PCT: 3 % (ref 0–5)
HCT: 36.4 % (ref 36.0–46.0)
HEMOGLOBIN: 11.4 g/dL — AB (ref 12.0–15.0)
Lymphocytes Relative: 26 % (ref 12–46)
Lymphs Abs: 1.7 10*3/uL (ref 0.7–4.0)
MCH: 24.6 pg — AB (ref 26.0–34.0)
MCHC: 31.3 g/dL (ref 30.0–36.0)
MCV: 78.6 fL (ref 78.0–100.0)
MPV: 8.9 fL (ref 8.6–12.4)
Monocytes Absolute: 0.5 10*3/uL (ref 0.1–1.0)
Monocytes Relative: 8 % (ref 3–12)
Neutro Abs: 4 10*3/uL (ref 1.7–7.7)
Neutrophils Relative %: 62 % (ref 43–77)
Platelets: 402 10*3/uL — ABNORMAL HIGH (ref 150–400)
RBC: 4.63 MIL/uL (ref 3.87–5.11)
RDW: 16.7 % — ABNORMAL HIGH (ref 11.5–15.5)
WBC: 6.4 10*3/uL (ref 4.0–10.5)

## 2015-01-20 LAB — LIPID PANEL
Cholesterol: 143 mg/dL (ref 0–200)
HDL: 54 mg/dL (ref >=46)
LDL CALC: 65 mg/dL (ref 0–99)
Total CHOL/HDL Ratio: 2.6 Ratio
Triglycerides: 120 mg/dL (ref <150)
VLDL: 24 mg/dL (ref 0–40)

## 2015-01-20 LAB — COMPREHENSIVE METABOLIC PANEL
ALT: 10 U/L (ref 0–35)
AST: 16 U/L (ref 0–37)
Albumin: 3.7 g/dL (ref 3.5–5.2)
Alkaline Phosphatase: 78 U/L (ref 39–117)
BILIRUBIN TOTAL: 0.3 mg/dL (ref 0.2–1.2)
BUN: 10 mg/dL (ref 6–23)
CALCIUM: 9.6 mg/dL (ref 8.4–10.5)
CHLORIDE: 107 meq/L (ref 96–112)
CO2: 28 meq/L (ref 19–32)
Creat: 0.89 mg/dL (ref 0.50–1.10)
Glucose, Bld: 108 mg/dL — ABNORMAL HIGH (ref 70–99)
Potassium: 4.8 mEq/L (ref 3.5–5.3)
SODIUM: 143 meq/L (ref 135–145)
TOTAL PROTEIN: 6.4 g/dL (ref 6.0–8.3)

## 2015-01-21 ENCOUNTER — Encounter: Payer: Self-pay | Admitting: *Deleted

## 2015-01-25 ENCOUNTER — Encounter: Payer: Self-pay | Admitting: Cardiovascular Disease

## 2015-01-25 ENCOUNTER — Ambulatory Visit (INDEPENDENT_AMBULATORY_CARE_PROVIDER_SITE_OTHER): Payer: Medicare Other | Admitting: Cardiovascular Disease

## 2015-01-25 VITALS — BP 100/70 | HR 58 | Ht 61.0 in | Wt 168.7 lb

## 2015-01-25 DIAGNOSIS — I208 Other forms of angina pectoris: Secondary | ICD-10-CM

## 2015-01-25 DIAGNOSIS — I2510 Atherosclerotic heart disease of native coronary artery without angina pectoris: Secondary | ICD-10-CM | POA: Diagnosis not present

## 2015-01-25 DIAGNOSIS — I10 Essential (primary) hypertension: Secondary | ICD-10-CM | POA: Diagnosis not present

## 2015-01-25 MED ORDER — NITROGLYCERIN 0.4 MG SL SUBL: 0.4000 mg | SUBLINGUAL_TABLET | SUBLINGUAL | Status: DC | PRN

## 2015-01-25 MED ORDER — METOPROLOL TARTRATE 25 MG PO TABS: 25.0000 mg | ORAL_TABLET | Freq: Two times a day (BID) | ORAL | Status: DC

## 2015-01-25 MED ORDER — SIMVASTATIN 40 MG PO TABS: 40.0000 mg | ORAL_TABLET | Freq: Every day | ORAL | Status: DC

## 2015-01-25 MED ORDER — RAMIPRIL 5 MG PO CAPS: 5.0000 mg | ORAL_CAPSULE | Freq: Every day | ORAL | Status: DC

## 2015-01-25 MED ORDER — ISOSORBIDE MONONITRATE ER 30 MG PO TB24: 30.0000 mg | ORAL_TABLET | Freq: Every day | ORAL | Status: DC

## 2015-01-25 MED ORDER — PANTOPRAZOLE SODIUM 40 MG PO TBEC: 40.0000 mg | DELAYED_RELEASE_TABLET | Freq: Every day | ORAL | Status: DC

## 2015-01-25 MED ORDER — CLOPIDOGREL BISULFATE 75 MG PO TABS: 75.0000 mg | ORAL_TABLET | Freq: Every day | ORAL | Status: DC

## 2015-01-25 NOTE — Assessment & Plan Note (Signed)
History of hyperlipidemia on simvastatin 40 with recent lipid profile performed 01/19/15 revealed a total cholesterol 143, LDL 65 HDL of 54

## 2015-01-25 NOTE — Patient Instructions (Signed)
Your physician wants you to follow-up in: 1 Year. You will receive a reminder letter in the mail two months in advance. If you don't receive a letter, please call our office to schedule the follow-up appointment.  

## 2015-01-25 NOTE — Assessment & Plan Note (Signed)
History of CAD status post LAD stenting by Dr. Rollene Fare August 2005 with moderate disease in her nondominant right. A Myoview stress test performed 07/03/13 was normal. She denies chest pain or shortness of breath

## 2015-01-25 NOTE — Assessment & Plan Note (Signed)
History of hypertension blood pressure less than 100/70. She is on metoprolol and ramipril. Continue current meds at current dosing

## 2015-01-25 NOTE — Progress Notes (Signed)
01/25/2015 [name]   07/10/1951  [id number]  Primary Physician Kateri Mc, MD Primary Cardiologist: Lorretta Harp MD Renae Gloss   HPI:  [name] is a 64 year old white female I last saw 12 months ago. . She has a history of coronary artery disease with LAD stenting by Dr. Rollene Fare in August of 2005 she had moderate disease in her nondominant right. Just has mild sleep apnea, gastroesophageal reflux disease, and continued tobacco use. Her sister has liver cancer and has extremely poor prognosis and is entering a hospice facility. The patient is having a difficult time with this grief process. She is on Lexapro.  Over the last month she's had at least 12 episodes of chest discomfort described as a pressure she just feels a bulge in her chest, she cries, it resolves itself eventually. It will radiate down the left arm at times. She has not tried nitroglycerin sublingual. She has mild shortness of breath when it occurs but no nausea no diaphoresis. She also relates she's unable to sleep at night. She had a Myoview performed April of 2013 which was negative for ischemia.  Patient also has chronic back pain and is on I nerve stimulator for pain control.  Her hypertension and hypercholesterolemia are controlled and treated.  She was seen by Cecilie Kicks registered nurse practitioner on 07/01/13 with accelerated chest pain. She had been under a lot of stress because of of her younger sister being terminally ill.. A Myoview stress test was entirely normal. She was placed on low-dose all in all acting nitrates as well as Xanax. Her chest pain is markedly improved.since I saw her 12 months ago she has remained pain-free.unfortunately, her critically and terminally ill sister passed away this past August 13, 2023 of liver cancer.   Current Outpatient Prescriptions  Medication Sig Dispense Refill  . AMITIZA 8 MCG capsule Take 8 mcg by mouth as needed.    Marland Kitchen aspirin 81 MG tablet Take 81 mg  by mouth. 3 times a week    . clopidogrel (PLAVIX) 75 MG tablet Take 1 tablet (75 mg total) by mouth daily. 90 tablet 3  . DULoxetine (CYMBALTA) 20 MG capsule Take 20 mg by mouth.    . Gabapentin, PHN, 600 MG TABS Take by mouth. Takes 1 tablet at 2:00p and 3 tablets at 6:00p.    . HYDROcodone-acetaminophen (NORCO/VICODIN) 5-325 MG per tablet Take 1 tablet by mouth 3 (three) times daily.     . isosorbide mononitrate (IMDUR) 30 MG 24 hr tablet Take 1 tablet (30 mg total) by mouth daily. 90 tablet 3  . metoprolol tartrate (LOPRESSOR) 25 MG tablet Take 1 tablet (25 mg total) by mouth 2 (two) times daily. 180 tablet 3  . nitroGLYCERIN (NITROSTAT) 0.4 MG SL tablet Place 1 tablet (0.4 mg total) under the tongue every 5 (five) minutes as needed for chest pain. 25 tablet 2  . pantoprazole (PROTONIX) 40 MG tablet Take 1 tablet (40 mg total) by mouth daily. 90 tablet 3  . ramipril (ALTACE) 5 MG capsule Take 1 capsule (5 mg total) by mouth daily. 90 capsule 3  . simvastatin (ZOCOR) 40 MG tablet Take 1 tablet (40 mg total) by mouth daily at 6 PM. 90 tablet 3  . traMADol (ULTRAM) 50 MG tablet Take 50 mg by mouth as needed.     No current facility-administered medications for this visit.    Allergies  Allergen Reactions  . Codeine Nausea And Vomiting  . Percocet [Oxycodone-Acetaminophen] Other (See  Comments)    Stroke like symptoms    History   Social History  . Marital Status: Divorced    Spouse Name: N/A  . Number of Children: N/A  . Years of Education: N/A   Occupational History  . Not on file.   Social History Main Topics  . Smoking status: Current Every Day Smoker -- 1.00 packs/day for 40 years    Types: Cigarettes  . Smokeless tobacco: Never Used  . Alcohol Use: No  . Drug Use: No  . Sexual Activity: Yes    Birth Control/ Protection: Surgical   Other Topics Concern  . Not on file   Social History Narrative     Review of Systems: General: negative for chills, fever, night  sweats or weight changes.  Cardiovascular: negative for chest pain, dyspnea on exertion, edema, orthopnea, palpitations, paroxysmal nocturnal dyspnea or shortness of breath Dermatological: negative for rash Respiratory: negative for cough or wheezing Urologic: negative for hematuria Abdominal: negative for nausea, vomiting, diarrhea, bright red blood per rectum, melena, or hematemesis Neurologic: negative for visual changes, syncope, or dizziness All other systems reviewed and are otherwise negative except as noted above.    Blood pressure 100/70, pulse 58, height 5\' 1"  (1.549 m), weight 168 lb 11.2 oz (76.522 kg).  General appearance: alert and no distress Neck: no adenopathy, no carotid bruit, no JVD, supple, symmetrical, trachea midline and thyroid not enlarged, symmetric, no tenderness/mass/nodules Lungs: clear to auscultation bilaterally Heart: regular rate and rhythm, S1, S2 normal, no murmur, click, rub or gallop Extremities: extremities normal, atraumatic, no cyanosis or edema  EKG sinus bradycardia 58 without ST or T-wave changes. I personally reviewed this EKG  ASSESSMENT AND PLAN:   Essential hypertension History of hypertension blood pressure less than 100/70. She is on metoprolol and ramipril. Continue current meds at current dosing  HYPERCHOLESTEROLEMIA History of hyperlipidemia on simvastatin 40 with recent lipid profile performed 01/19/15 revealed a total cholesterol 143, LDL 65 HDL of 54  Coronary atherosclerosis History of CAD status post LAD stenting by Dr. Rollene Fare August 2005 with moderate disease in her nondominant right. A Myoview stress test performed 07/03/13 was normal. She denies chest pain or shortness of breath      Lorretta Harp MD [facility], [facility] 01/25/2015 11:57 AM

## 2015-04-18 ENCOUNTER — Telehealth: Payer: Self-pay | Admitting: *Deleted

## 2015-04-18 NOTE — Telephone Encounter (Signed)
Faxed completed Medical Consultation Request to [facility] (recieved signed release form via email from patient).

## 2015-06-06 ENCOUNTER — Other Ambulatory Visit: Payer: Self-pay | Admitting: Cardiovascular Disease

## 2015-09-07 ENCOUNTER — Other Ambulatory Visit: Payer: Self-pay | Admitting: *Deleted

## 2015-09-07 MED ORDER — PANTOPRAZOLE SODIUM 40 MG PO TBEC: 40.0000 mg | DELAYED_RELEASE_TABLET | Freq: Every day | ORAL | Status: DC

## 2015-09-07 MED ORDER — SIMVASTATIN 40 MG PO TABS: 40.0000 mg | ORAL_TABLET | Freq: Every day | ORAL | Status: DC

## 2015-09-07 MED ORDER — RAMIPRIL 5 MG PO CAPS: 5.0000 mg | ORAL_CAPSULE | Freq: Every day | ORAL | Status: DC

## 2015-09-07 MED ORDER — ISOSORBIDE MONONITRATE ER 30 MG PO TB24: 30.0000 mg | ORAL_TABLET | Freq: Every day | ORAL | Status: DC

## 2015-09-07 MED ORDER — CLOPIDOGREL BISULFATE 75 MG PO TABS: 75.0000 mg | ORAL_TABLET | Freq: Every day | ORAL | Status: DC

## 2015-09-19 ENCOUNTER — Other Ambulatory Visit: Payer: Self-pay | Admitting: *Deleted

## 2015-09-19 ENCOUNTER — Telehealth: Payer: Self-pay | Admitting: Cardiovascular Disease

## 2015-09-19 MED ORDER — CLOPIDOGREL BISULFATE 75 MG PO TABS: 75.0000 mg | ORAL_TABLET | Freq: Every day | ORAL | Status: DC

## 2015-09-19 MED ORDER — RAMIPRIL 5 MG PO CAPS: 5.0000 mg | ORAL_CAPSULE | Freq: Every day | ORAL | Status: DC

## 2015-09-19 MED ORDER — ISOSORBIDE MONONITRATE ER 30 MG PO TB24: 30.0000 mg | ORAL_TABLET | Freq: Every day | ORAL | Status: DC

## 2015-09-19 MED ORDER — SIMVASTATIN 40 MG PO TABS: 40.0000 mg | ORAL_TABLET | Freq: Every day | ORAL | Status: DC

## 2015-09-19 MED ORDER — METOPROLOL TARTRATE 25 MG PO TABS: 25.0000 mg | ORAL_TABLET | Freq: Two times a day (BID) | ORAL | Status: DC

## 2015-09-19 NOTE — Telephone Encounter (Signed)
Rx(s) sent to pharmacy electronically.  

## 2015-09-19 NOTE — Telephone Encounter (Signed)
[place]  [phone]  isosorbide mononitrate (IMDUR) 30 MG 24 hr tablet  clopidogrel (PLAVIX) 75 MG tablet  ramipril (ALTACE) 5 MG capsule  simvastatin (ZOCOR) 40 MG tablet  Patient requested to make sure pharmacy does not fill. She will call when needed.

## 2015-09-19 NOTE — Telephone Encounter (Signed)
REFILL 

## 2015-09-19 NOTE — Telephone Encounter (Signed)
[address] does not handle refills for the other offices at this time.

## 2015-12-05 ENCOUNTER — Telehealth: Payer: Self-pay | Admitting: Cardiovascular Disease

## 2015-12-05 NOTE — Telephone Encounter (Signed)
Pt was visiting her PCP on last week and her Hbg was found to be 7.7. She was sent to [facility] on 5/5 and received 3 units of PRBC. Pt wants to know if she should return to taking her ASA 3 times a week and her 75 mg of Plavix? Pt does not have a repeat CBC for 2 weeks. Pt has a history of GI bleeds and that is whey she is on alternative ASA regimen. Pt has f/u scheduled on 01/03/16 with Dr. Gwenlyn Found  Pt would also like GI physician recommendation as her has retired.   Told will forward information to Dr. Gwenlyn Found to review and will get back with her once I know more. Pt verbalized understanding, no additional questions at this time.

## 2015-12-05 NOTE — Telephone Encounter (Signed)
New message   Pt verbalized that she had to have a blood transfusion   she wants to know if she should hold off on the Plavix or the baby Asprin please call pt

## 2015-12-09 ENCOUNTER — Telehealth: Payer: Self-pay | Admitting: Cardiovascular Disease

## 2015-12-09 DIAGNOSIS — E7800 Pure hypercholesterolemia, unspecified: Secondary | ICD-10-CM

## 2015-12-09 DIAGNOSIS — Z8719 Personal history of other diseases of the digestive system: Secondary | ICD-10-CM

## 2015-12-09 NOTE — Telephone Encounter (Signed)
Spoke with patient and she actually had the labs done 11/28/15 that are viewable in [place]

## 2015-12-09 NOTE — Telephone Encounter (Signed)
Pt aware okay to stop taking her Plavix. She will let her PCP know. remineded of 6/6 appt with Dr. Gwenlyn Found. Pt verbalized understanding, no additional questions at this time.

## 2015-12-09 NOTE — Telephone Encounter (Signed)
New message      The pt is needing a request form sent  to the pt to take the lab to get blood work done, the pt gets blood work drawn in [place].

## 2015-12-09 NOTE — Telephone Encounter (Signed)
Follow-up      The pt wanted to mentioned if the pt needs cholestrol level with blood work

## 2015-12-09 NOTE — Telephone Encounter (Signed)
OK to stop Plavix

## 2015-12-09 NOTE — Telephone Encounter (Signed)
Ordered LP/CMET/CBC Mailed to patient

## 2016-01-03 ENCOUNTER — Encounter: Payer: Self-pay | Admitting: Cardiovascular Disease

## 2016-01-03 ENCOUNTER — Telehealth: Payer: Self-pay | Admitting: Cardiovascular Disease

## 2016-01-03 ENCOUNTER — Ambulatory Visit (INDEPENDENT_AMBULATORY_CARE_PROVIDER_SITE_OTHER): Payer: Medicare Other | Admitting: Cardiovascular Disease

## 2016-01-03 VITALS — BP 144/84 | HR 48 | Ht 61.0 in | Wt 160.8 lb

## 2016-01-03 DIAGNOSIS — I2510 Atherosclerotic heart disease of native coronary artery without angina pectoris: Secondary | ICD-10-CM | POA: Diagnosis not present

## 2016-01-03 DIAGNOSIS — E7800 Pure hypercholesterolemia, unspecified: Secondary | ICD-10-CM

## 2016-01-03 DIAGNOSIS — I10 Essential (primary) hypertension: Secondary | ICD-10-CM

## 2016-01-03 MED ORDER — ESCITALOPRAM OXALATE 10 MG PO TABS: 10.0000 mg | ORAL_TABLET | Freq: Every day | ORAL | Status: DC

## 2016-01-03 NOTE — Telephone Encounter (Signed)
Returned call to pt and gave her the names of Dr Carol Ada and Dr Verdia Kuba per Dr Gwenlyn Found.

## 2016-01-03 NOTE — Telephone Encounter (Signed)
New message      Pt was just seen.  Dr Gwenlyn Found was going to give her names of 2 doctors that he recommended for her to see to get a colonoscopy.  She forgot to get their names.  Please call and give her the names and phone numbers of the doctors

## 2016-01-03 NOTE — Progress Notes (Signed)
01/03/2016 [name]   Mar 22, 1951  [id number]  Primary Physician Kateri Mc, MD Primary Cardiologist: Lorretta Harp MD Renae Gloss   HPI:  [name] is a 65 -year-old white female I last saw her in the office 01/25/15. Marland Kitchen She has a history of coronary artery disease with LAD stenting by Dr. Rollene Fare in August of 2005 she had moderate disease in her nondominant right. Just has mild sleep apnea, gastroesophageal reflux disease, and continued tobacco use. Her sister has liver cancer and has extremely poor prognosis and is entering a hospice facility. The patient is having a difficult time with this grief process. She is on Lexapro.  Over the last month she's had at least 12 episodes of chest discomfort described as a pressure she just feels a bulge in her chest, she cries, it resolves itself eventually. It will radiate down the left arm at times. She has not tried nitroglycerin sublingual. She has mild shortness of breath when it occurs but no nausea no diaphoresis. She also relates she's unable to sleep at night. She had a Myoview performed April of 2013 which was negative for ischemia.  Patient also has chronic back pain and is on I nerve stimulator for pain control.  Her hypertension and hypercholesterolemia are controlled and treated.  She was seen by Cecilie Kicks registered nurse practitioner on 07/01/13 with accelerated chest pain. She had been under a lot of stress because of of her younger sister being terminally ill.. A Myoview stress test was entirely normal. She was placed on low-dose all in all acting nitrates as well as Xanax. ince I saw her a year ago she started clinically well. She has become anemic with hemoglobins in the 7 range. I have told her to stop Plavix. She will need a colonoscopy.  Current Outpatient Prescriptions  Medication Sig Dispense Refill  . AMITIZA 8 MCG capsule Take 8 mcg by mouth as needed.    Marland Kitchen aspirin 81 MG tablet Take 81 mg by mouth. 3  times a week    . Gabapentin, PHN, 600 MG TABS Take by mouth. Takes 1 tablet at 2:00p and 3 tablets at 6:00p.    . HYDROcodone-acetaminophen (NORCO/VICODIN) 5-325 MG per tablet Take 1 tablet by mouth 3 (three) times daily.     . isosorbide mononitrate (IMDUR) 30 MG 24 hr tablet Take 1 tablet (30 mg total) by mouth daily. 90 tablet 1  . metoprolol tartrate (LOPRESSOR) 25 MG tablet Take 1 tablet (25 mg total) by mouth 2 (two) times daily. 180 tablet 1  . nitroGLYCERIN (NITROSTAT) 0.4 MG SL tablet Place 1 tablet (0.4 mg total) under the tongue every 5 (five) minutes as needed for chest pain. 25 tablet 2  . pantoprazole (PROTONIX) 40 MG tablet Take 1 tablet (40 mg total) by mouth daily. 90 tablet 2  . ramipril (ALTACE) 5 MG capsule Take 1 capsule (5 mg total) by mouth daily. 90 capsule 1  . simvastatin (ZOCOR) 40 MG tablet Take 1 tablet (40 mg total) by mouth daily at 6 PM. 90 tablet 1  . traMADol (ULTRAM) 50 MG tablet Take 50 mg by mouth as needed.    Marland Kitchen escitalopram (LEXAPRO) 10 MG tablet Take 1 tablet (10 mg total) by mouth daily. 90 tablet 3   No current facility-administered medications for this visit.    Allergies  Allergen Reactions  . Codeine Nausea And Vomiting  . Percocet [Oxycodone-Acetaminophen] Other (See Comments)    Stroke like symptoms  Social History   Social History  . Marital Status: Divorced    Spouse Name: N/A  . Number of Children: N/A  . Years of Education: N/A   Occupational History  . Not on file.   Social History Main Topics  . Smoking status: Current Every Day Smoker -- 1.00 packs/day for 40 years    Types: Cigarettes  . Smokeless tobacco: Never Used  . Alcohol Use: No  . Drug Use: No  . Sexual Activity: Yes    Birth Control/ Protection: Surgical   Other Topics Concern  . Not on file   Social History Narrative     Review of Systems: General: negative for chills, fever, night sweats or weight changes.  Cardiovascular: negative for chest pain,  dyspnea on exertion, edema, orthopnea, palpitations, paroxysmal nocturnal dyspnea or shortness of breath Dermatological: negative for rash Respiratory: negative for cough or wheezing Urologic: negative for hematuria Abdominal: negative for nausea, vomiting, diarrhea, bright red blood per rectum, melena, or hematemesis Neurologic: negative for visual changes, syncope, or dizziness All other systems reviewed and are otherwise negative except as noted above.    Blood pressure 144/84, pulse 48, height 5\' 1"  (1.549 m), weight 160 lb 12.8 oz (72.938 kg).  General appearance: alert and no distress Neck: no adenopathy, no carotid bruit, no JVD, supple, symmetrical, trachea midline and thyroid not enlarged, symmetric, no tenderness/mass/nodules Lungs: clear to auscultation bilaterally Heart: regular rate and rhythm, S1, S2 normal, no murmur, click, rub or gallop Extremities: extremities normal, atraumatic, no cyanosis or edema  EKG sinus bradycardia 48 incomplete right bundle-branch block and nonspecific ST and T-wave changes. I personally reviewed this EKG  ASSESSMENT AND PLAN:   HYPERCHOLESTEROLEMIA History of hyperlipidemia on statin therapy followed by her PCP  Essential hypertension History of hypertension blood pressure measured at 144/84. She is on metoprolol and ramipril. Continue current meds at current dosing  Coronary atherosclerosis History of CAD with moderate disease by cardiac catheterization August 2005 perform a Dr. Rollene Fare. She does have a history of LAD stenting in the past She had a Myoview performed in April 2013 which is nonischemic. She denies chest pain or shortness of breath.      Lorretta Harp MD FACP,FACC,FAHA, [facility] 01/03/2016 10:52 AM

## 2016-01-03 NOTE — Assessment & Plan Note (Signed)
History of CAD with moderate disease by cardiac catheterization August 2005 perform a Dr. Rollene Fare. She does have a history of LAD stenting in the past She had a Myoview performed in April 2013 which is nonischemic. She denies chest pain or shortness of breath.

## 2016-01-03 NOTE — Patient Instructions (Signed)
Medication Instructions:  Your physician has recommended you make the following change in your medication:  1- START lexapro 10 mg (1 tablet) by mouth daily.   Labwork: NONE  Testing/Procedures: NONE  Follow-Up: Your physician wants you to follow-up in: [place]. You will receive a reminder letter in the mail two months in advance. If you don't receive a letter, please call our office to schedule the follow-up appointment.   Any Other Special Instructions Will Be Listed Below (If Applicable).     If you need a refill on your cardiac medications before your next appointment, please call your pharmacy.

## 2016-01-03 NOTE — Assessment & Plan Note (Signed)
History of hyperlipidemia on statin therapy followed by her PCP. 

## 2016-01-03 NOTE — Telephone Encounter (Signed)
Message routed to [place], [place] & Dr. Gwenlyn Found to follow up w/recomnmendations

## 2016-01-03 NOTE — Assessment & Plan Note (Signed)
History of hypertension blood pressure measured at 144/84. She is on metoprolol and ramipril. Continue current meds at current dosing

## 2016-01-25 ENCOUNTER — Encounter: Payer: Self-pay | Admitting: Gastroenterology

## 2016-02-13 ENCOUNTER — Other Ambulatory Visit: Payer: Self-pay

## 2016-02-13 MED ORDER — RAMIPRIL 5 MG PO CAPS: 5.0000 mg | ORAL_CAPSULE | Freq: Every day | ORAL | Status: DC

## 2016-02-22 ENCOUNTER — Other Ambulatory Visit: Payer: Self-pay

## 2016-02-22 MED ORDER — METOPROLOL TARTRATE 25 MG PO TABS: 25.0000 mg | ORAL_TABLET | Freq: Two times a day (BID) | ORAL | 1 refills | Status: DC

## 2016-03-08 ENCOUNTER — Encounter: Payer: Medicare Other | Admitting: Gastroenterology

## 2016-04-20 ENCOUNTER — Telehealth: Payer: Self-pay | Admitting: *Deleted

## 2016-04-20 NOTE — Telephone Encounter (Signed)
Received in the mail letter from patient with the following statements from pt: "Enclosed you will find the report from Dr Particia Jasper who recently performed by colonoscopy and endoscopy. You had removed by Plavix pending the outcome of theses tests. I am still taking 81mg  of aspirin 3 times a week. Please advise if the aspirin is enough and your decision concerning Plavix."  Dr Gwenlyn Found reviewed documents and advised that Aspirin is fine. No new orders.  Called pt and gave her Dr Kennon Holter advice and she verbalized understanding to continue aspirin.

## 2016-06-26 ENCOUNTER — Other Ambulatory Visit: Payer: Self-pay

## 2016-06-26 MED ORDER — SIMVASTATIN 40 MG PO TABS: 40.0000 mg | ORAL_TABLET | Freq: Every day | ORAL | 2 refills | Status: DC

## 2016-07-06 ENCOUNTER — Other Ambulatory Visit: Payer: Self-pay

## 2016-07-06 MED ORDER — ISOSORBIDE MONONITRATE ER 30 MG PO TB24: 30.0000 mg | ORAL_TABLET | Freq: Every day | ORAL | 1 refills | Status: DC

## 2016-11-12 ENCOUNTER — Telehealth: Payer: Self-pay | Admitting: Cardiovascular Disease

## 2016-11-12 DIAGNOSIS — I2510 Atherosclerotic heart disease of native coronary artery without angina pectoris: Secondary | ICD-10-CM

## 2016-11-12 DIAGNOSIS — E7800 Pure hypercholesterolemia, unspecified: Secondary | ICD-10-CM

## 2016-11-12 DIAGNOSIS — Z79899 Other long term (current) drug therapy: Secondary | ICD-10-CM

## 2016-11-12 NOTE — Telephone Encounter (Signed)
New Message     Please mail her orders for blood work so she can go get them done

## 2016-11-12 NOTE — Telephone Encounter (Signed)
Spoke to patient  Patient state she has not had la work done this year by primary , that DR BERRY ALWAYS ORDER LAB .  PATIENT STATES NO LONGER HAS UHC/MEDICARE , SHE HAS MEDICARE/BCBCS  GENERIC LAB SLIP MAILED TO PATIENT SHE SHE TATES SHE WILL HAVE LA WORK DONE THE END OF MAY.

## 2016-11-13 ENCOUNTER — Telehealth: Payer: Self-pay | Admitting: Cardiovascular Disease

## 2016-11-13 NOTE — Telephone Encounter (Signed)
Generic Lab order slip signed by Dr. Gwenlyn Found and mailed to pt. (CMET and Lipid Panel)

## 2016-11-20 ENCOUNTER — Other Ambulatory Visit: Payer: Self-pay

## 2016-11-20 MED ORDER — METOPROLOL TARTRATE 25 MG PO TABS: 25.0000 mg | ORAL_TABLET | Freq: Two times a day (BID) | ORAL | 0 refills | Status: DC

## 2016-11-27 ENCOUNTER — Other Ambulatory Visit: Payer: Self-pay

## 2016-11-27 MED ORDER — PANTOPRAZOLE SODIUM 40 MG PO TBEC: 40.0000 mg | DELAYED_RELEASE_TABLET | Freq: Every day | ORAL | 2 refills | Status: DC

## 2017-01-02 ENCOUNTER — Encounter: Payer: Self-pay | Admitting: Cardiovascular Disease

## 2017-01-02 ENCOUNTER — Ambulatory Visit (INDEPENDENT_AMBULATORY_CARE_PROVIDER_SITE_OTHER): Payer: Medicare Other | Admitting: Cardiovascular Disease

## 2017-01-02 VITALS — BP 128/78 | HR 63 | Ht 61.0 in | Wt 154.0 lb

## 2017-01-02 DIAGNOSIS — E7800 Pure hypercholesterolemia, unspecified: Secondary | ICD-10-CM

## 2017-01-02 DIAGNOSIS — I10 Essential (primary) hypertension: Secondary | ICD-10-CM | POA: Diagnosis not present

## 2017-01-02 DIAGNOSIS — I2510 Atherosclerotic heart disease of native coronary artery without angina pectoris: Secondary | ICD-10-CM

## 2017-01-02 MED ORDER — NITROGLYCERIN 0.4 MG SL SUBL: 0.4000 mg | SUBLINGUAL_TABLET | SUBLINGUAL | 3 refills | Status: DC | PRN

## 2017-01-02 MED ORDER — METOPROLOL TARTRATE 25 MG PO TABS: 25.0000 mg | ORAL_TABLET | Freq: Two times a day (BID) | ORAL | 3 refills | Status: DC

## 2017-01-02 MED ORDER — RAMIPRIL 5 MG PO CAPS: 5.0000 mg | ORAL_CAPSULE | Freq: Every day | ORAL | 3 refills | Status: DC

## 2017-01-02 MED ORDER — SIMVASTATIN 40 MG PO TABS: 40.0000 mg | ORAL_TABLET | Freq: Every day | ORAL | 3 refills | Status: DC

## 2017-01-02 MED ORDER — ISOSORBIDE MONONITRATE ER 30 MG PO TB24: 30.0000 mg | ORAL_TABLET | Freq: Every day | ORAL | 3 refills | Status: DC

## 2017-01-02 NOTE — Assessment & Plan Note (Signed)
History of hyperlipidemia on statin therapy with recent lipid profile performed by her PCP 12/25/16 revealing a total cholesterol of 132, LDL 60 and HDL of 53.

## 2017-01-02 NOTE — Assessment & Plan Note (Signed)
History of essential hypertension blood pressure measured 128/78. She is on metoprolol and ramipril. Continue current meds at current dosing

## 2017-01-02 NOTE — Patient Instructions (Signed)
Medication Instructions:  Your physician recommends that you continue on your current medications as directed. Please refer to the Current Medication list given to you today.  Labwork: NONE  Testing/Procedures: NONE  Follow-Up: Your physician wants you to follow-up in: 1 year with Dr. Gwenlyn Found. You will receive a reminder letter in the mail two months in advance. If you don't receive a letter, please call our office to schedule the follow-up appointment.   Any Other Special Instructions Will Be Listed Below (If Applicable).     If you need a refill on your cardiac medications before your next appointment, please call your pharmacy.

## 2017-01-02 NOTE — Assessment & Plan Note (Signed)
History of CAD status post LAD stenting by Dr. Rollene Fare August 2005. Cholesterol test performed in December 2014 was nonischemic. She denies chest pain or shortness of breath.

## 2017-01-02 NOTE — Progress Notes (Signed)
01/02/2017 [name]   1951-02-13  [id number]  Primary Physician Kateri Mc, MD Primary Cardiologist: Lorretta Harp MD [name], [place]  HPI:  [name] is a 66 year old white female I last saw her in the office 01/03/16. Marland Kitchen She has a history of coronary artery disease with LAD stenting by Dr. Rollene Fare in August of 2005 she had moderate disease in her nondominant right. Just has mild sleep apnea, gastroesophageal reflux disease, and continued tobacco use. Her sister has liver cancer and has extremely poor prognosis and is entering a hospice facility. The patient is having a difficult time with this grief process. She is on Lexapro.  Over the last month she's had at least 12 episodes of chest discomfort described as a pressure she just feels a bulge in her chest, she cries, it resolves itself eventually. It will radiate down the left arm at times. She has not tried nitroglycerin sublingual. She has mild shortness of breath when it occurs but no nausea no diaphoresis. She also relates she's unable to sleep at night. She had a Myoview performed April of 2013 which was negative for ischemia.  Patient also has chronic back pain and is on I nerve stimulator for pain control.  Her hypertension and hypercholesterolemia are controlled and treated.  She was seen by [name] registered nurse practitioner on 07/01/13 with accelerated chest pain. She had been under a lot of stress because of of her younger sister being terminally ill.. A Myoview stress test was entirely normal. She was placed on low-dose all in all acting nitrates as well as Xanax. ince I saw her a year ago she started clinically well. She has become anemic with hemoglobins in the 7 range. I have told her to stop Plavix.     Current Outpatient Prescriptions  Medication Sig Dispense Refill  . aspirin 81 MG tablet Take 81 mg by mouth. 3 times a week    . escitalopram (LEXAPRO) 10 MG tablet Take 1 tablet (10 mg total) by  mouth daily. 90 tablet 3  . Gabapentin, PHN, 600 MG TABS Take by mouth. Takes 1 tablet at 2:00p and 3 tablets at 6:00p.    . isosorbide mononitrate (IMDUR) 30 MG 24 hr tablet Take 1 tablet (30 mg total) by mouth daily. 90 tablet 3  . metoprolol tartrate (LOPRESSOR) 25 MG tablet Take 1 tablet (25 mg total) by mouth 2 (two) times daily. 180 tablet 3  . nitroGLYCERIN (NITROSTAT) 0.4 MG SL tablet Place 1 tablet (0.4 mg total) under the tongue every 5 (five) minutes as needed for chest pain. 25 tablet 3  . pantoprazole (PROTONIX) 40 MG tablet Take 1 tablet (40 mg total) by mouth daily. 90 tablet 2  . ramipril (ALTACE) 5 MG capsule Take 1 capsule (5 mg total) by mouth daily. 90 capsule 3  . simvastatin (ZOCOR) 40 MG tablet Take 1 tablet (40 mg total) by mouth daily at 6 PM. 90 tablet 3  . tapentadol (NUCYNTA ER) 100 MG 12 hr tablet Take 100 mg by mouth 3 (three) times daily.    . traMADol (ULTRAM) 50 MG tablet Take 50 mg by mouth as needed.    . AMITIZA 8 MCG capsule Take 8 mcg by mouth as needed.    Marland Kitchen HYDROcodone-acetaminophen (NORCO/VICODIN) 5-325 MG per tablet Take 1 tablet by mouth 3 (three) times daily.      No current facility-administered medications for this visit.     Allergies  Allergen Reactions  .  Codeine Nausea And Vomiting  . Percocet [Oxycodone-Acetaminophen] Other (See Comments)    Stroke like symptoms    Social History   Social History  . Marital status: Divorced    Spouse name: N/A  . Number of children: N/A  . Years of education: N/A   Occupational History  . Not on file.   Social History Main Topics  . Smoking status: Current Every Day Smoker    Packs/day: 1.00    Years: 40.00    Types: Cigarettes  . Smokeless tobacco: Never Used  . Alcohol use No  . Drug use: No  . Sexual activity: Yes    Birth control/ protection: Surgical   Other Topics Concern  . Not on file   Social History Narrative  . No narrative on file     Review of Systems: General:  negative for chills, fever, night sweats or weight changes.  Cardiovascular: negative for chest pain, dyspnea on exertion, edema, orthopnea, palpitations, paroxysmal nocturnal dyspnea or shortness of breath Dermatological: negative for rash Respiratory: negative for cough or wheezing Urologic: negative for hematuria Abdominal: negative for nausea, vomiting, diarrhea, bright red blood per rectum, melena, or hematemesis Neurologic: negative for visual changes, syncope, or dizziness All other systems reviewed and are otherwise negative except as noted above.    Blood pressure 128/78, pulse 63, height 5\' 1"  (1.549 m), weight 154 lb (69.9 kg).  General appearance: alert and no distress Neck: no adenopathy, no carotid bruit, no JVD, supple, symmetrical, trachea midline and thyroid not enlarged, symmetric, no tenderness/mass/nodules Lungs: clear to auscultation bilaterally Heart: regular rate and rhythm, S1, S2 normal, no murmur, click, rub or gallop Extremities: extremities normal, atraumatic, no cyanosis or edema  EKG sinus rhythm at 63 without ST or T-wave changes. I personally reviewed this EKG  ASSESSMENT AND PLAN:   HYPERCHOLESTEROLEMIA History of hyperlipidemia on statin therapy with recent lipid profile performed by her PCP 12/25/16 revealing a total cholesterol of 132, LDL 60 and HDL of 53.  Essential hypertension History of essential hypertension blood pressure measured 128/78. She is on metoprolol and ramipril. Continue current meds at current dosing  Coronary atherosclerosis History of CAD status post LAD stenting by Dr. Rollene Fare August 2005. Cholesterol test performed in December 2014 was nonischemic. She denies chest pain or shortness of breath.      Lorretta Harp MD FACP,FACC,FAHA, [facility] 01/02/2017 10:58 AM

## 2017-01-03 ENCOUNTER — Telehealth: Payer: Self-pay | Admitting: Cardiovascular Disease

## 2017-01-03 MED ORDER — ESCITALOPRAM OXALATE 10 MG PO TABS: 10.0000 mg | ORAL_TABLET | Freq: Every day | ORAL | 6 refills | Status: DC

## 2017-01-03 MED ORDER — PANTOPRAZOLE SODIUM 40 MG PO TBEC: 40.0000 mg | DELAYED_RELEASE_TABLET | Freq: Every day | ORAL | 6 refills | Status: DC

## 2017-01-03 NOTE — Telephone Encounter (Signed)
Spoke with pt, aware she will need to continue both medications. She reports dr berry wrote the original script. Refill sent to the pharmacy electronically.

## 2017-01-03 NOTE — Telephone Encounter (Signed)
New message   Pt is calling stating she was seen yesterday and when her medication was called in two were missed. lexapro and pantoprazole. She is calling to find out if she needs to take these or should they be called in.

## 2017-01-15 ENCOUNTER — Other Ambulatory Visit: Payer: Self-pay | Admitting: *Deleted

## 2017-01-15 MED ORDER — ISOSORBIDE MONONITRATE ER 30 MG PO TB24: 30.0000 mg | ORAL_TABLET | Freq: Every day | ORAL | 3 refills | Status: DC

## 2017-01-16 ENCOUNTER — Telehealth: Payer: Self-pay | Admitting: Cardiovascular Disease

## 2017-01-16 NOTE — Telephone Encounter (Signed)
Returned call. Notes her BPs have been "erratic" and she's noted two hypotensive readings this morning.   At about 4am she was 78/58  94/68 at 8am  "usually around 120/70 "  No symptoms reported.  Pt states that she used to have to make sure her metoprolol was manufactured by [employer], but this company is out of business now.  She wants advice on what to do with her meds. At last OV instructed to continue ramipril and metoprolol for HTN at current dosing.  Advised her that we will probably need to cut or hold ramipril 1st - will seek confirmatory guidance on this from pharmD. Pt expressed understanding and thanks.

## 2017-01-16 NOTE — Telephone Encounter (Signed)
New message   Pt c/o medication issue:  1. Name of Medication: metoprolol tartrate (LOPRESSOR) 25 MG tablet  2. How are you currently taking this medication (dosage and times per day)? 25mg   3. Are you having a reaction (difficulty breathing--STAT)? Blood pressure still low  4. What is your medication issue? Not helping low blood pressure

## 2017-01-16 NOTE — Telephone Encounter (Signed)
Spoke with patient - she notes that she is developing headaches at times in the middle of the night, this causes her to have some aching in her chest.  She notes that her BP at those times has been low.  This AM at 4 her BP was 78/58.  She was asymptomatic in that she reported no dizziness or orthostatic problems.  She held her metoprolol last night because the headache was starting.    She has recently started Nucynta, but there is no indication of hypotension being a side effect.  She did have to cut the original dose back because of nightmares.   Advised her to cut ramipril for now and call back in a week with her home readings and any symptoms

## 2017-04-05 ENCOUNTER — Other Ambulatory Visit: Payer: Self-pay | Admitting: *Deleted

## 2017-04-05 NOTE — Telephone Encounter (Signed)
Entry error

## 2017-05-21 ENCOUNTER — Other Ambulatory Visit: Payer: Self-pay | Admitting: Orthopedic Surgery

## 2017-05-21 DIAGNOSIS — Z981 Arthrodesis status: Secondary | ICD-10-CM

## 2017-07-22 ENCOUNTER — Telehealth: Payer: Self-pay | Admitting: Cardiovascular Disease

## 2017-07-22 MED ORDER — ESCITALOPRAM OXALATE 10 MG PO TABS: 10.0000 mg | ORAL_TABLET | Freq: Every day | ORAL | 1 refills | Status: DC

## 2017-07-22 NOTE — Telephone Encounter (Signed)
New message     *STAT* If patient is at the pharmacy, call can be transferred to refill team.   1. Which medications need to be refilled? (please list name of each medication and dose if known) escitalopram (LEXAPRO) 10 MG tablet  2. Which pharmacy/location (including street and city if local pharmacy) is medication to be sent to? [place]  3. Do they need a 30 day or 90 day supply? [place]

## 2017-11-26 ENCOUNTER — Telehealth: Payer: Self-pay | Admitting: Cardiovascular Disease

## 2017-11-26 NOTE — Telephone Encounter (Signed)
PT AWARE LAB ORDERS MAILED TO PT .Adonis Housekeeper

## 2017-11-26 NOTE — Telephone Encounter (Signed)
New message    Patient requesting lab order be mailed to her to get annual labs.

## 2018-01-06 ENCOUNTER — Other Ambulatory Visit: Payer: Self-pay | Admitting: *Deleted

## 2018-01-06 MED ORDER — SIMVASTATIN 40 MG PO TABS: 40.0000 mg | ORAL_TABLET | Freq: Every day | ORAL | 0 refills | Status: DC

## 2018-01-13 ENCOUNTER — Other Ambulatory Visit: Payer: Self-pay

## 2018-01-13 MED ORDER — PANTOPRAZOLE SODIUM 40 MG PO TBEC: 40.0000 mg | DELAYED_RELEASE_TABLET | Freq: Every day | ORAL | 0 refills | Status: DC

## 2018-01-13 NOTE — Telephone Encounter (Signed)
Rx(s) sent to pharmacy electronically.  

## 2018-01-15 ENCOUNTER — Encounter: Payer: Self-pay | Admitting: Cardiovascular Disease

## 2018-01-22 ENCOUNTER — Encounter: Payer: Self-pay | Admitting: Cardiovascular Disease

## 2018-01-22 ENCOUNTER — Ambulatory Visit (INDEPENDENT_AMBULATORY_CARE_PROVIDER_SITE_OTHER): Payer: Medicare Other | Admitting: Cardiovascular Disease

## 2018-01-22 VITALS — BP 128/80 | HR 58 | Ht 61.0 in | Wt 149.0 lb

## 2018-01-22 DIAGNOSIS — E7800 Pure hypercholesterolemia, unspecified: Secondary | ICD-10-CM | POA: Diagnosis not present

## 2018-01-22 DIAGNOSIS — I2510 Atherosclerotic heart disease of native coronary artery without angina pectoris: Secondary | ICD-10-CM

## 2018-01-22 DIAGNOSIS — I10 Essential (primary) hypertension: Secondary | ICD-10-CM

## 2018-01-22 MED ORDER — ISOSORBIDE MONONITRATE ER 30 MG PO TB24: 30.0000 mg | ORAL_TABLET | Freq: Every day | ORAL | 12 refills | Status: DC

## 2018-01-22 MED ORDER — PANTOPRAZOLE SODIUM 40 MG PO TBEC: 40.0000 mg | DELAYED_RELEASE_TABLET | Freq: Every day | ORAL | 12 refills | Status: DC

## 2018-01-22 MED ORDER — METOPROLOL TARTRATE 25 MG PO TABS: 12.5000 mg | ORAL_TABLET | Freq: Two times a day (BID) | ORAL | 12 refills | Status: DC

## 2018-01-22 MED ORDER — SIMVASTATIN 40 MG PO TABS: 40.0000 mg | ORAL_TABLET | Freq: Every day | ORAL | 12 refills | Status: DC

## 2018-01-22 NOTE — Assessment & Plan Note (Signed)
History of COPD with ongoing tobacco abuse of 1 pack/day 

## 2018-01-22 NOTE — Progress Notes (Signed)
01/22/2018 [name]   05/26/1951  [id number]  Primary Physician Kateri Mc, MD Primary Cardiologist: Lorretta Harp MD FACP, [place], [place], [place]  HPI:  [name] is a 67 y.o.  white female I last saw her in the office  01/02/2017. Marland Kitchen She has a history of coronary artery disease with LAD stenting by Dr. Rollene Fare in August of 2005 she had moderate disease in her nondominant right. Just has mild sleep apnea, gastroesophageal reflux disease, and continued tobacco use. Her sister has liver cancer and has extremely poor prognosis and is entering a hospice facility. The patient is having a difficult time with this grief process. She is on Lexapro.  Over the last month she's had at least 12 episodes of chest discomfort described as a pressure she just feels a bulge in her chest, she cries, it resolves itself eventually. It will radiate down the left arm at times. She has not tried nitroglycerin sublingual. She has mild shortness of breath when it occurs but no nausea no diaphoresis. She also relates she's unable to sleep at night. She had a Myoview performed April of 2013 which was negative for ischemia.  Patient also has chronic back pain and is on I nerve stimulator for pain control.  Her hypertension and hypercholesterolemia are controlled and treated.  She was seen by Cecilie Kicks registered nurse practitioner on 07/01/13 with accelerated chest pain. She had been under a lot of stress because of of her younger sister being terminally ill.. A Myoview stress test was entirely normal. She was placed on low-dose all in all acting nitrates as well as Xanax. ince I saw her a year ago she started clinically well. She has become anemic with hemoglobins in the 7 range. I have told her to stop Plavix.  Since I saw her a year ago she is done well.  She has had several episodes of chest pain within the last month similar to her prior episodes.  He continues to smoke 1 pack/day.     Current  Meds  Medication Sig  . aspirin 81 MG tablet Take 81 mg by mouth. 3 times a week  . isosorbide mononitrate (IMDUR) 30 MG 24 hr tablet Take 1 tablet (30 mg total) by mouth daily.  . metoprolol tartrate (LOPRESSOR) 25 MG tablet Take by mouth 2 (two) times daily. Pt takes 12.5mg  2 times daily  . nitroGLYCERIN (NITROSTAT) 0.4 MG SL tablet Place 1 tablet (0.4 mg total) under the tongue every 5 (five) minutes as needed for chest pain.  . pantoprazole (PROTONIX) 40 MG tablet Take 1 tablet (40 mg total) by mouth daily. MUST KEEP APPOINTMENT 01/22/18 WITH DR Jenkins Risdon FOR FUTURE REFILLS  . simvastatin (ZOCOR) 40 MG tablet Take 1 tablet (40 mg total) by mouth daily at 6 PM.  . tapentadol (NUCYNTA ER) 100 MG 12 hr tablet Take 100 mg by mouth 3 (three) times daily.  . traMADol (ULTRAM) 50 MG tablet Take 50 mg by mouth as needed.     Allergies  Allergen Reactions  . Codeine Nausea And Vomiting  . Percocet [Oxycodone-Acetaminophen] Other (See Comments)    Stroke like symptoms    Social History   Socioeconomic History  . Marital status: Divorced    Spouse name: Not on file  . Number of children: Not on file  . Years of education: Not on file  . Highest education level: Not on file  Occupational History  . Not on file  Social  Needs  . Financial resource strain: Not on file  . Food insecurity:    Worry: Not on file    Inability: Not on file  . Transportation needs:    Medical: Not on file    Non-medical: Not on file  Tobacco Use  . Smoking status: Current Every Day Smoker    Packs/day: 1.00    Years: 40.00    Pack years: 40.00    Types: Cigarettes  . Smokeless tobacco: Never Used  Substance and Sexual Activity  . Alcohol use: No  . Drug use: No  . Sexual activity: Yes    Birth control/protection: Surgical  Lifestyle  . Physical activity:    Days per week: Not on file    Minutes per session: Not on file  . Stress: Not on file  Relationships  . Social connections:    Talks on phone:  Not on file    Gets together: Not on file    Attends religious service: Not on file    Active member of club or organization: Not on file    Attends meetings of clubs or organizations: Not on file    Relationship status: Not on file  . Intimate partner violence:    Fear of current or ex partner: Not on file    Emotionally abused: Not on file    Physically abused: Not on file    Forced sexual activity: Not on file  Other Topics Concern  . Not on file  Social History Narrative  . Not on file     Review of Systems: General: negative for chills, fever, night sweats or weight changes.  Cardiovascular: negative for chest pain, dyspnea on exertion, edema, orthopnea, palpitations, paroxysmal nocturnal dyspnea or shortness of breath Dermatological: negative for rash Respiratory: negative for cough or wheezing Urologic: negative for hematuria Abdominal: negative for nausea, vomiting, diarrhea, bright red blood per rectum, melena, or hematemesis Neurologic: negative for visual changes, syncope, or dizziness All other systems reviewed and are otherwise negative except as noted above.    Blood pressure 128/80, pulse (!) 58, height 5\' 1"  (1.549 m), weight 149 lb (67.6 kg).  General appearance: alert and no distress Neck: no adenopathy, no carotid bruit, no JVD, supple, symmetrical, trachea midline and thyroid not enlarged, symmetric, no tenderness/mass/nodules Lungs: clear to auscultation bilaterally Heart: regular rate and rhythm, S1, S2 normal, no murmur, click, rub or gallop Extremities: extremities normal, atraumatic, no cyanosis or edema Pulses: 2+ and symmetric Skin: Skin color, texture, turgor normal. No rashes or lesions Neurologic: Alert and oriented X 3, normal strength and tone. Normal symmetric reflexes. Normal coordination and gait  EKG sinus bradycardia 58 without ST or T wave changes.  I personally reviewed this EKG  ASSESSMENT AND PLAN:   HYPERCHOLESTEROLEMIA History of  hyperlipidemia on statin therapy lipid profile performed by her PCP 01/16/2018 revealing total cholesterol 141, LDL 64 and HDL of 57.  Essential hypertension History of essential hypertension her blood pressure measured at 128/80.  She is on metoprolol.  Continue current meds at current dosing.  Coronary atherosclerosis History of minimal CAD with stenting of her LAD by Dr. Rollene Fare August 2005 which time she had moderate disease in a nondominant RCA.  She does get occasional chest pain.  She had a negative Myoview stress test April 2013 as well as December 2014.  COPD History of COPD with ongoing tobacco abuse of 1 pack/day.      Lorretta Harp MD FACP,FACC,FAHA, [facility] 01/22/2018 11:20 AM

## 2018-01-22 NOTE — Patient Instructions (Signed)
Your physician wants you to follow-up in: ONE YEAR WITH DR BERRY You will receive a reminder letter in the mail two months in advance. If you don't receive a letter, please call our office to schedule the follow-up appointment.   If you need a refill on your cardiac medications before your next appointment, please call your pharmacy.  

## 2018-01-22 NOTE — Assessment & Plan Note (Signed)
History of essential hypertension her blood pressure measured at 128/80.  She is on metoprolol.  Continue current meds at current dosing.

## 2018-01-22 NOTE — Assessment & Plan Note (Signed)
History of minimal CAD with stenting of her LAD by Dr. Rollene Fare August 2005 which time she had moderate disease in a nondominant RCA.  She does get occasional chest pain.  She had a negative Myoview stress test April 2013 as well as December 2014.

## 2018-01-22 NOTE — Assessment & Plan Note (Signed)
History of hyperlipidemia on statin therapy lipid profile performed by her PCP 01/16/2018 revealing total cholesterol 141, LDL 64 and HDL of 57.

## 2018-11-12 ENCOUNTER — Telehealth: Payer: Self-pay | Admitting: Cardiovascular Disease

## 2018-11-12 DIAGNOSIS — I10 Essential (primary) hypertension: Secondary | ICD-10-CM

## 2018-11-12 DIAGNOSIS — E7800 Pure hypercholesterolemia, unspecified: Secondary | ICD-10-CM

## 2018-11-12 DIAGNOSIS — Z79899 Other long term (current) drug therapy: Secondary | ICD-10-CM

## 2018-11-12 DIAGNOSIS — I2510 Atherosclerotic heart disease of native coronary artery without angina pectoris: Secondary | ICD-10-CM

## 2018-11-12 NOTE — Telephone Encounter (Signed)
  Patient has made an appt January 28, 2019 and would like to have her lab orders mailed to her so she can get them done at [facility] in [place] before her appt

## 2018-11-12 NOTE — Telephone Encounter (Signed)
MAILED LABSLIP ,AS REQUESTED

## 2019-01-19 ENCOUNTER — Telehealth: Payer: Self-pay

## 2019-01-19 NOTE — Telephone Encounter (Signed)
Called pt to change appointment back to OV as Dr. Gwenlyn Found will be in office that date. Pt state she would prefer to keep virtual appointment.

## 2019-01-21 LAB — COMPREHENSIVE METABOLIC PANEL
ALT: 16 IU/L (ref 0–32)
AST: 19 IU/L (ref 0–40)
Albumin/Globulin Ratio: 2.2 (ref 1.2–2.2)
Albumin: 4.1 g/dL (ref 3.8–4.8)
Alkaline Phosphatase: 76 IU/L (ref 39–117)
BUN/Creatinine Ratio: 10 — ABNORMAL LOW (ref 12–28)
BUN: 8 mg/dL (ref 8–27)
Bilirubin Total: 0.5 mg/dL (ref 0.0–1.2)
CO2: 21 mmol/L (ref 20–29)
Calcium: 9.3 mg/dL (ref 8.7–10.3)
Chloride: 105 mmol/L (ref 96–106)
Creatinine, Ser: 0.81 mg/dL (ref 0.57–1.00)
GFR calc Af Amer: 87 mL/min/{1.73_m2} (ref >59)
GFR calc non Af Amer: 75 mL/min/{1.73_m2} (ref >59)
Globulin, Total: 1.9 g/dL (ref 1.5–4.5)
Glucose: 87 mg/dL (ref 65–99)
Potassium: 3.8 mmol/L (ref 3.5–5.2)
Sodium: 142 mmol/L (ref 134–144)
Total Protein: 6 g/dL (ref 6.0–8.5)

## 2019-01-21 LAB — LIPID PANEL
Chol/HDL Ratio: 2.7 ratio (ref 0.0–4.4)
Cholesterol, Total: 141 mg/dL (ref 100–199)
HDL: 53 mg/dL (ref >39)
LDL Calculated: 63 mg/dL (ref 0–99)
Triglycerides: 127 mg/dL (ref 0–149)
VLDL Cholesterol Cal: 25 mg/dL (ref 5–40)

## 2019-01-23 ENCOUNTER — Telehealth: Payer: Self-pay

## 2019-01-23 NOTE — Telephone Encounter (Signed)

## 2019-01-28 ENCOUNTER — Telehealth: Payer: Medicare Other | Admitting: Cardiovascular Disease

## 2019-02-11 ENCOUNTER — Telehealth: Payer: Self-pay

## 2019-02-11 ENCOUNTER — Telehealth (INDEPENDENT_AMBULATORY_CARE_PROVIDER_SITE_OTHER): Payer: Medicare Other | Admitting: Cardiovascular Disease

## 2019-02-11 DIAGNOSIS — I2510 Atherosclerotic heart disease of native coronary artery without angina pectoris: Secondary | ICD-10-CM | POA: Diagnosis not present

## 2019-02-11 DIAGNOSIS — Z720 Tobacco use: Secondary | ICD-10-CM

## 2019-02-11 DIAGNOSIS — I10 Essential (primary) hypertension: Secondary | ICD-10-CM | POA: Diagnosis not present

## 2019-02-11 DIAGNOSIS — E7800 Pure hypercholesterolemia, unspecified: Secondary | ICD-10-CM

## 2019-02-11 NOTE — Patient Instructions (Signed)

## 2019-02-11 NOTE — Telephone Encounter (Signed)
Left detailed message, per DPR, of pt 7/15 AVS instructions and stated AVS to be mailed to pt.   Letter including After Visit Summary and any other necessary documents to be mailed to the patient's address on file.
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
## 2019-02-23 ENCOUNTER — Telehealth: Payer: Self-pay | Admitting: Cardiovascular Disease

## 2019-02-23 MED ORDER — SIMVASTATIN 40 MG PO TABS: 40.0000 mg | ORAL_TABLET | Freq: Every day | ORAL | 3 refills | Status: DC

## 2019-02-23 MED ORDER — METOPROLOL TARTRATE 25 MG PO TABS: 12.5000 mg | ORAL_TABLET | Freq: Two times a day (BID) | ORAL | 3 refills | Status: DC

## 2019-02-23 MED ORDER — ISOSORBIDE MONONITRATE ER 30 MG PO TB24: 30.0000 mg | ORAL_TABLET | Freq: Every day | ORAL | 3 refills | Status: DC

## 2019-02-23 MED ORDER — NITROGLYCERIN 0.4 MG SL SUBL: 0.4000 mg | SUBLINGUAL_TABLET | SUBLINGUAL | 3 refills | Status: DC | PRN

## 2019-02-23 MED ORDER — PANTOPRAZOLE SODIUM 40 MG PO TBEC: 40.0000 mg | DELAYED_RELEASE_TABLET | Freq: Every day | ORAL | 3 refills | Status: AC

## 2019-02-23 NOTE — Telephone Encounter (Signed)
Spoke with pt and informed that the following meds have been sent to her pharmacy for refill: Protonix 40 mg, Imdur 30 mg, metoprolol 25 mg, Zocor 40 mg, and nitroglycerin 0.4mg . Confirmed with pt that pharmacy is [facility], [place]. No further questions/concerns

## 2019-02-23 NOTE — Telephone Encounter (Signed)
New message   Patient states that she has not gotten the new prescriptions from her visit last week with Dr. Gwenlyn Found. They are: protonix 40 mg, imdur 30 mg, metoprolol 25 mg, Zocor 40 mg, nitroglycerin. Please send medication to [place], [place].  Please call with any questions.

## 2020-01-21 ENCOUNTER — Telehealth: Payer: Self-pay | Admitting: Cardiovascular Disease

## 2020-01-21 DIAGNOSIS — E7800 Pure hypercholesterolemia, unspecified: Secondary | ICD-10-CM

## 2020-01-21 DIAGNOSIS — I2510 Atherosclerotic heart disease of native coronary artery without angina pectoris: Secondary | ICD-10-CM

## 2020-01-21 DIAGNOSIS — Z79899 Other long term (current) drug therapy: Secondary | ICD-10-CM

## 2020-01-21 NOTE — Telephone Encounter (Signed)
    Pt said she usually get lab work before seeing Dr. Gwenlyn Found. She wanted to know if he wants her to get one this year.

## 2020-01-22 NOTE — Telephone Encounter (Signed)
Yes, FLP prior to office visit is fine

## 2020-01-22 NOTE — Telephone Encounter (Signed)
Pt updated and verbalized understanding. Orders placed and put in the mail.

## 2020-01-28 NOTE — Telephone Encounter (Signed)
Spoke to patient, she did not receive lab orders in mail.   Advised would fax to [facility] in [place] (Lipid, Hepatic) but recommended calling before to verify they were received.     Also advised this could be completed at [facility] next week as well but wouldn't have results at time of appt (appt in AM).   Patient verbalized understanding.

## 2020-01-28 NOTE — Telephone Encounter (Signed)
Attempt to return call to patient-no answer and unable to leave VM (VM not set up).

## 2020-01-28 NOTE — Addendum Note (Signed)
Addended by: Patria Mane A on: 01/28/2020 05:45 PM   Modules accepted: Orders

## 2020-01-28 NOTE — Telephone Encounter (Addendum)
Returned call to patient, she states she has not received lab order yet.  She would like to get this done at the [facility] in [place] tomorrow.      Lab order mailed 6/25.   Patient request to call back after 11am to see if it comes in the mail today, if not request order be faxed to [facility].    Will return call after 11, patient aware   [facility]:  [address] [place], [place] [address] FAX [phone]

## 2020-01-28 NOTE — Telephone Encounter (Signed)
Follow up   Pt called back, she said she hasn't receive the order paper so she can have her labs in [place].

## 2020-02-02 ENCOUNTER — Ambulatory Visit: Payer: Medicare PPO | Admitting: Cardiovascular Disease

## 2020-02-02 ENCOUNTER — Encounter: Payer: Self-pay | Admitting: Cardiovascular Disease

## 2020-02-02 ENCOUNTER — Other Ambulatory Visit: Payer: Self-pay

## 2020-02-02 DIAGNOSIS — E7800 Pure hypercholesterolemia, unspecified: Secondary | ICD-10-CM | POA: Diagnosis not present

## 2020-02-02 DIAGNOSIS — I2510 Atherosclerotic heart disease of native coronary artery without angina pectoris: Secondary | ICD-10-CM

## 2020-02-02 DIAGNOSIS — I10 Essential (primary) hypertension: Secondary | ICD-10-CM | POA: Diagnosis not present

## 2020-02-02 LAB — HEPATIC FUNCTION PANEL
ALT: 14 IU/L (ref 0–32)
AST: 25 IU/L (ref 0–40)
Albumin: 4.1 g/dL (ref 3.8–4.8)
Alkaline Phosphatase: 69 IU/L (ref 48–121)
Bilirubin Total: 0.5 mg/dL (ref 0.0–1.2)
Bilirubin, Direct: 0.19 mg/dL (ref 0.00–0.40)
Total Protein: 6.1 g/dL (ref 6.0–8.5)

## 2020-02-02 LAB — LIPID PANEL
Chol/HDL Ratio: 2.3 ratio (ref 0.0–4.4)
Cholesterol, Total: 145 mg/dL (ref 100–199)
HDL: 63 mg/dL (ref >39)
LDL Chol Calc (NIH): 65 mg/dL (ref 0–99)
Triglycerides: 91 mg/dL (ref 0–149)
VLDL Cholesterol Cal: 17 mg/dL (ref 5–40)

## 2020-02-02 MED ORDER — HYDROCHLOROTHIAZIDE 12.5 MG PO CAPS
ORAL_CAPSULE | ORAL | 0 refills | Status: DC
Start: 2020-02-02 — End: 2022-02-27

## 2020-02-02 NOTE — Assessment & Plan Note (Signed)
History of hyperlipidemia on statin therapy with lipid profile performed 01/20/2019 revealing total cholesterol 141, LDL 63 and HDL 53.  We will recheck a lipid liver profile.

## 2020-02-02 NOTE — Assessment & Plan Note (Signed)
History of essential hypertension a blood pressure measured today 120/82.  She is on metoprolol.

## 2020-02-02 NOTE — Progress Notes (Signed)
02/02/2020 [name]   [birth year]  [id number]  Primary Physician Kateri Mc, MD Primary Cardiologist: Lorretta Harp MD Garret Reddish, [place], [place]  HPI:  [name] is a 69 y.o.  thin appearing white female who I last spoke to on the phone for a virtual telemedicine phone visit 02/11/2019.Marland Kitchen She has a history of coronary artery disease with LAD stenting by Dr. Rollene Fare in August of 2005 she had moderate disease in her nondominant right. Just has mild sleep apnea, gastroesophageal reflux disease, and continued tobacco use. Her sister has liver cancer and has extremely poor prognosis and is entering a hospice facility. The patient is having a difficult time with this grief process. She is on Lexapro.  Over the last month she's had at least 12 episodes of chest discomfort described as a pressure she just feels a bulge in her chest, she cries, it resolves itself eventually. It will radiate down the left arm at times. She has not tried nitroglycerin sublingual. She has mild shortness of breath when it occurs but no nausea no diaphoresis. She also relates she's unable to sleep at night. She had a Myoview performed April of 2013 which was negative for ischemia.  Patient also has chronic back pain and is on I nerve stimulator for pain control.   Her hypertension and hypercholesterolemia are controlled and treated.  She was seen by Cecilie Kicks registered nurse practitioner on 07/01/13 with accelerated chest pain. She had been under a lot of stress because of of her younger sister being terminally ill.. A Myoview stress test was entirely normal. She was placed on low-dose all in all acting nitrates as well as Xanax. ince I saw her a year ago she started clinically well. She has become anemic with hemoglobins in the 7 range. I have told her to stop Plavix.  She had some AVMs cauterized and her hemoglobin returned to baseline.  Since I spoke to her on the phone a year ago she continues to do  well.  She has some mild ankle edema at the end of the day which she attributes somewhat to the heat.  She continues to smoke a pack a day.  She denies chest pain or shortness of breath.    Current Meds  Medication Sig  . aspirin 81 MG tablet Take 81 mg by mouth 3 (three) times a week.   . escitalopram (LEXAPRO) 10 MG tablet Take 10 mg by mouth daily.  . folic acid (FOLVITE) 782 MCG tablet Take 400 mcg by mouth daily.  Marland Kitchen gabapentin (NEURONTIN) 100 MG capsule Take 100 mg by mouth 2 (two) times daily.  . isosorbide mononitrate (IMDUR) 30 MG 24 hr tablet Take 1 tablet (30 mg total) by mouth daily.  . metoprolol tartrate (LOPRESSOR) 25 MG tablet Take 0.5 tablets (12.5 mg total) by mouth 2 (two) times daily. Pt takes 12.5mg  2 times daily  . Multiple Vitamin tablet Take 1 tablet by mouth daily.  . nitroGLYCERIN (NITROSTAT) 0.4 MG SL tablet Place 1 tablet (0.4 mg total) under the tongue every 5 (five) minutes as needed for chest pain.  . pantoprazole (PROTONIX) 40 MG tablet Take 1 tablet (40 mg total) by mouth daily.  . simvastatin (ZOCOR) 40 MG tablet Take 1 tablet (40 mg total) by mouth daily at 6 PM.  . tapentadol (NUCYNTA ER) 100 MG 12 hr tablet Take 100 mg by mouth 2 (two) times daily.   . traMADol (ULTRAM) 50 MG tablet Take  50 mg by mouth as needed.  . [DISCONTINUED] Cyanocobalamin (VITAMIN B-12) 5000 MCG TBDP Take 1 tablet by mouth 4 (four) times a week.     Allergies  Allergen Reactions  . Codeine Nausea And Vomiting  . Percocet [Oxycodone-Acetaminophen] Other (See Comments)    Stroke like symptoms    Social History   Socioeconomic History  . Marital status: Divorced    Spouse name: Not on file  . Number of children: Not on file  . Years of education: Not on file  . Highest education level: Not on file  Occupational History  . Not on file  Tobacco Use  . Smoking status: Current Every Day Smoker    Packs/day: 1.00    Years: 40.00    Pack years: 40.00    Types: Cigarettes   . Smokeless tobacco: Never Used  Substance and Sexual Activity  . Alcohol use: No  . Drug use: No  . Sexual activity: Yes    Birth control/protection: Surgical  Other Topics Concern  . Not on file  Social History Narrative  . Not on file   Social Determinants of Health   Financial Resource Strain:   . Difficulty of Paying Living Expenses:   Food Insecurity:   . Worried About Charity fundraiser in the Last Year:   . Arboriculturist in the Last Year:   Transportation Needs:   . Film/video editor (Medical):   Marland Kitchen Lack of Transportation (Non-Medical):   Physical Activity:   . Days of Exercise per Week:   . Minutes of Exercise per Session:   Stress:   . Feeling of Stress :   Social Connections:   . Frequency of Communication with Friends and Family:   . Frequency of Social Gatherings with Friends and Family:   . Attends Religious Services:   . Active Member of Clubs or Organizations:   . Attends Archivist Meetings:   Marland Kitchen Marital Status:   Intimate Partner Violence:   . Fear of Current or Ex-Partner:   . Emotionally Abused:   Marland Kitchen Physically Abused:   . Sexually Abused:      Review of Systems: General: negative for chills, fever, night sweats or weight changes.  Cardiovascular: negative for chest pain, dyspnea on exertion, edema, orthopnea, palpitations, paroxysmal nocturnal dyspnea or shortness of breath Dermatological: negative for rash Respiratory: negative for cough or wheezing Urologic: negative for hematuria Abdominal: negative for nausea, vomiting, diarrhea, bright red blood per rectum, melena, or hematemesis Neurologic: negative for visual changes, syncope, or dizziness All other systems reviewed and are otherwise negative except as noted above.    Blood pressure 120/82, pulse 66, height 5\' 1"  (1.549 m), weight 157 lb (71.2 kg), SpO2 97 %.  General appearance: alert and no distress Neck: no adenopathy, no carotid bruit, no JVD, supple, symmetrical,  trachea midline and thyroid not enlarged, symmetric, no tenderness/mass/nodules Lungs: clear to auscultation bilaterally Heart: regular rate and rhythm, S1, S2 normal, no murmur, click, rub or gallop Extremities: extremities normal, atraumatic, no cyanosis or edema Pulses: 2+ and symmetric Skin: Skin color, texture, turgor normal. No rashes or lesions Neurologic: Alert and oriented X 3, normal strength and tone. Normal symmetric reflexes. Normal coordination and gait  EKG sinus rhythm at 66 without ST or T wave changes. I  Personally reviewed this EKG.  ASSESSMENT AND PLAN:   HYPERCHOLESTEROLEMIA History of hyperlipidemia on statin therapy with lipid profile performed 01/20/2019 revealing total cholesterol 141, LDL 63 and HDL  53.  We will recheck a lipid liver profile.  Essential hypertension History of essential hypertension a blood pressure measured today 120/82.  She is on metoprolol.  Coronary atherosclerosis History of CAD status post LAD stenting by Dr. Rollene Fare August 2005 with moderate disease in her nondominant RCA.  Her last Myoview performed April 2013 was nonischemic.  She denies chest pain.  COPD History of COPD with ongoing tobacco abuse of 1 pack/day recalcitrant to risk factor modification.      Lorretta Harp MD FACP,FACC,FAHA, [facility] 02/02/2020 10:15 AM

## 2020-02-02 NOTE — Patient Instructions (Addendum)
Medication Instructions:  START HYDROCHLOROTHIAZIDE 12.5 MG  AS NEEDED FOR SWELLING   *If you need a refill on your cardiac medications before your next appointment, please call your pharmacy*  Lab Work: LP/HFP TODAY   If you have labs (blood work) drawn today and your tests are completely normal, you will receive your results only by: Marland Kitchen MyChart Message (if you have MyChart) OR . A paper copy in the mail If you have any lab test that is abnormal or we need to change your treatment, we will call you to review the results.  Testing/Procedures: NONE   Follow-Up: At [facility], you and your health needs are our priority.  As part of our continuing mission to provide you with exceptional heart care, we have created designated Provider Care Teams.  These Care Teams include your primary Cardiologist (physician) and Advanced Practice Providers (APPs -  Physician Assistants and Nurse Practitioners) who all work together to provide you with the care you need, when you need it.  We recommend signing up for the patient portal called "MyChart".  Sign up information is provided on this After Visit Summary.  MyChart is used to connect with patients for Virtual Visits (Telemedicine).  Patients are able to view lab/test results, encounter notes, upcoming appointments, etc.  Non-urgent messages can be sent to your provider as well.   To learn more about what you can do with MyChart, go to [email or web address].    Your next appointment:   12 month(s)  You will receive a reminder letter in the mail two months in advance. If you don't receive a letter, please call our office to schedule the follow-up appointment.  The format for your next appointment:   In Person  Provider:   You may see DR Gwenlyn Found  or one of the following Advanced Practice Providers on your designated Care Team:    Kerin Ransom, PA-C  [place], [place]  Coletta Memos, [place]

## 2020-02-02 NOTE — Assessment & Plan Note (Signed)
History of COPD with ongoing tobacco abuse of 1 pack/day recalcitrant to risk factor modification. 

## 2020-02-02 NOTE — Assessment & Plan Note (Signed)
History of CAD status post LAD stenting by Dr. Rollene Fare August 2005 with moderate disease in her nondominant RCA.  Her last Myoview performed April 2013 was nonischemic.  She denies chest pain.

## 2020-02-15 ENCOUNTER — Other Ambulatory Visit: Payer: Self-pay | Admitting: Cardiovascular Disease

## 2020-02-15 MED ORDER — SIMVASTATIN 40 MG PO TABS: 40.0000 mg | ORAL_TABLET | Freq: Every day | ORAL | 3 refills | Status: DC

## 2020-02-15 MED ORDER — METOPROLOL TARTRATE 25 MG PO TABS: 12.5000 mg | ORAL_TABLET | Freq: Two times a day (BID) | ORAL | 3 refills | Status: DC

## 2020-02-15 MED ORDER — ISOSORBIDE MONONITRATE ER 30 MG PO TB24: 30.0000 mg | ORAL_TABLET | Freq: Every day | ORAL | 3 refills | Status: DC

## 2020-02-15 NOTE — Telephone Encounter (Signed)
*  STAT* If patient is at the pharmacy, call can be transferred to refill team.   1. Which medications need to be refilled? (please list name of each medication and dose if known) simvastatin (ZOCOR) 40 MG tablet, metoprolol tartrate (LOPRESSOR) 25 MG tablet, isosorbide mononitrate (IMDUR) 30 MG 24 hr tablet  2. Which pharmacy/location (including street and city if local pharmacy) is medication to be sent to? [place], [place]  3. Do they need a 30 day or 90 day supply? [place]

## 2020-02-15 NOTE — Telephone Encounter (Signed)
Rx(s) sent to pharmacy electronically.  

## 2020-04-12 ENCOUNTER — Other Ambulatory Visit: Payer: Self-pay | Admitting: Orthopedic Surgery

## 2020-04-12 DIAGNOSIS — M542 Cervicalgia: Secondary | ICD-10-CM

## 2020-04-20 ENCOUNTER — Other Ambulatory Visit (HOSPITAL_BASED_OUTPATIENT_CLINIC_OR_DEPARTMENT_OTHER): Payer: Medicare PPO

## 2020-04-20 ENCOUNTER — Encounter (HOSPITAL_BASED_OUTPATIENT_CLINIC_OR_DEPARTMENT_OTHER): Payer: Self-pay

## 2020-04-20 ENCOUNTER — Other Ambulatory Visit (HOSPITAL_BASED_OUTPATIENT_CLINIC_OR_DEPARTMENT_OTHER): Payer: Self-pay | Admitting: Orthopedic Surgery

## 2020-04-20 DIAGNOSIS — M542 Cervicalgia: Secondary | ICD-10-CM

## 2020-04-21 ENCOUNTER — Ambulatory Visit (HOSPITAL_BASED_OUTPATIENT_CLINIC_OR_DEPARTMENT_OTHER)
Admission: RE | Admit: 2020-04-21 | Discharge: 2020-04-21 | Disposition: A | Discharge status: Home / Self Care | Payer: Medicare PPO | Source: Ambulatory Visit | Attending: Orthopedic Surgery | Admitting: Orthopedic Surgery

## 2020-04-21 ENCOUNTER — Other Ambulatory Visit: Payer: Self-pay

## 2020-04-21 DIAGNOSIS — M542 Cervicalgia: Secondary | ICD-10-CM | POA: Diagnosis present

## 2020-04-25 ENCOUNTER — Other Ambulatory Visit: Payer: Medicare PPO

## 2020-12-13 ENCOUNTER — Telehealth: Payer: Self-pay | Admitting: Cardiovascular Disease

## 2020-12-13 DIAGNOSIS — Z79899 Other long term (current) drug therapy: Secondary | ICD-10-CM

## 2020-12-13 DIAGNOSIS — E7800 Pure hypercholesterolemia, unspecified: Secondary | ICD-10-CM

## 2020-12-13 DIAGNOSIS — I10 Essential (primary) hypertension: Secondary | ICD-10-CM

## 2020-12-13 NOTE — Telephone Encounter (Signed)
LP/CMET ordered and mailed to patient

## 2020-12-13 NOTE — Telephone Encounter (Signed)
PT is requesting that a order be sent to her for her to go get her blood work done prior to her appt on 7/27.PT states she gets her blood work done locally

## 2021-02-15 ENCOUNTER — Telehealth: Payer: Self-pay | Admitting: Cardiovascular Disease

## 2021-02-15 NOTE — Telephone Encounter (Signed)
FYI---Patient states she had lab work with [facility] in [place] this morning. She states they are sending the results to our office. She is hoping Dr. Gwenlyn Found can review her results before her appointment on 02/22/21.

## 2021-02-16 ENCOUNTER — Other Ambulatory Visit: Payer: Self-pay | Admitting: Cardiovascular Disease

## 2021-02-16 LAB — COMPREHENSIVE METABOLIC PANEL
ALT: 16 IU/L (ref 0–32)
AST: 25 IU/L (ref 0–40)
Albumin/Globulin Ratio: 2.2 (ref 1.2–2.2)
Albumin: 4.2 g/dL (ref 3.8–4.8)
Alkaline Phosphatase: 88 IU/L (ref 44–121)
BUN/Creatinine Ratio: 13 (ref 12–28)
BUN: 11 mg/dL (ref 8–27)
Bilirubin Total: 0.3 mg/dL (ref 0.0–1.2)
CO2: 25 mmol/L (ref 20–29)
Calcium: 9.2 mg/dL (ref 8.7–10.3)
Chloride: 104 mmol/L (ref 96–106)
Creatinine, Ser: 0.83 mg/dL (ref 0.57–1.00)
Globulin, Total: 1.9 g/dL (ref 1.5–4.5)
Glucose: 102 mg/dL — ABNORMAL HIGH (ref 65–99)
Potassium: 4.4 mmol/L (ref 3.5–5.2)
Sodium: 144 mmol/L (ref 134–144)
Total Protein: 6.1 g/dL (ref 6.0–8.5)
eGFR: 76 mL/min/{1.73_m2} (ref >59)

## 2021-02-16 LAB — LIPID PANEL
Chol/HDL Ratio: 2.5 ratio (ref 0.0–4.4)
Cholesterol, Total: 128 mg/dL (ref 100–199)
HDL: 51 mg/dL (ref >39)
LDL Chol Calc (NIH): 55 mg/dL (ref 0–99)
Triglycerides: 126 mg/dL (ref 0–149)
VLDL Cholesterol Cal: 22 mg/dL (ref 5–40)

## 2021-02-22 ENCOUNTER — Ambulatory Visit: Payer: Medicare PPO | Admitting: Cardiovascular Disease

## 2021-02-22 ENCOUNTER — Encounter: Payer: Self-pay | Admitting: Cardiovascular Disease

## 2021-02-22 ENCOUNTER — Other Ambulatory Visit: Payer: Self-pay

## 2021-02-22 VITALS — BP 140/94 | HR 73 | Ht 61.0 in | Wt 162.2 lb

## 2021-02-22 DIAGNOSIS — E7800 Pure hypercholesterolemia, unspecified: Secondary | ICD-10-CM | POA: Diagnosis not present

## 2021-02-22 DIAGNOSIS — I10 Essential (primary) hypertension: Secondary | ICD-10-CM

## 2021-02-22 DIAGNOSIS — I2510 Atherosclerotic heart disease of native coronary artery without angina pectoris: Secondary | ICD-10-CM

## 2021-02-22 DIAGNOSIS — Z720 Tobacco use: Secondary | ICD-10-CM | POA: Diagnosis not present

## 2021-02-22 NOTE — Assessment & Plan Note (Signed)
History of essential hypertension blood pressure measured today at 140/94 although she says she just recently took her blood pressure medicines which include hydrochlorothiazide and metoprolol.

## 2021-02-22 NOTE — Assessment & Plan Note (Signed)
History of coronary artery disease status post LAD stenting by Dr. Rollene Fare August 2005.  Her last Myoview stress test performed 07/03/2013 was nonischemic.  She does have occasional chest pain which is not changed in frequency or severity.

## 2021-02-22 NOTE — Progress Notes (Signed)
02/22/2021 [name]   1950-10-01  [id number]  Primary Physician Kateri Mc, MD Primary Cardiologist: Lorretta Harp MD FACP, [place], [place], [place]  HPI:  [name] is a 70 y.o.  thin appearing white female who I last saw in the office 02/02/2020.Marland Kitchen She has a history of coronary artery disease with LAD stenting by Dr. Rollene Fare in August of 2005 she had moderate disease in her nondominant right. Just has mild sleep apnea, gastroesophageal reflux disease, and continued tobacco use. Her sister has liver cancer and has extremely poor prognosis and is entering a hospice facility. The patient is having a difficult time with this grief process. She is on Lexapro. Over the last month she's had at least 12 episodes of chest discomfort described as a pressure she just feels a bulge in her chest, she cries, it resolves itself eventually. It will radiate down the left arm at times. She has not tried nitroglycerin sublingual. She has mild shortness of breath when it occurs but no nausea no diaphoresis. She also relates she's unable to sleep at night. She had a Myoview performed April of 2013 which was negative for ischemia.Patient also has chronic back pain and is on I nerve stimulator for pain control.   Her hypertension and hypercholesterolemia are controlled and treated. She was seen by Cecilie Kicks registered nurse practitioner on 07/01/13 with accelerated chest pain. She had been under a lot of stress because of of her younger sister being terminally ill.. A Myoview stress test was entirely normal. She was placed on low-dose all in all acting nitrates as well as Xanax. ince I saw her a year ago she started clinically well. She has become anemic with hemoglobins in the 7 range. I have told her to stop Plavix.   She had some AVMs cauterized and her hemoglobin returned to baseline.   Since I saw her a year ago she continues to do well.  She does get occasional chest pain which has not changed in  frequency or severity.  She continues to smoke 1 pack/day.   Current Meds  Medication Sig   albuterol (VENTOLIN HFA) 108 (90 Base) MCG/ACT inhaler Inhale 2 puffs into the lungs every 6 (six) hours as needed.   ammonium lactate (LAC-HYDRIN) 12 % lotion ammonium lactate 12 % lotion  apply TWICE DAILY TO ARMS AND HANDS TO INCREASE moisture AND reduce easy brusing. IT can also be used safely ON NECK, CHEST, AND LEGS IF desired. FACIAL USE is fine but THE scent can be bothersome.   aspirin 81 MG tablet Take 81 mg by mouth 3 (three) times a week.    escitalopram (LEXAPRO) 10 MG tablet Take 10 mg by mouth daily.   folic acid (FOLVITE) Q000111Q MCG tablet Take 400 mcg by mouth daily.   gabapentin (NEURONTIN) 100 MG capsule Take 100 mg by mouth 2 (two) times daily.   hydrochlorothiazide (MICROZIDE) 12.5 MG capsule AS NEEDED FOR SWELLING   isosorbide mononitrate (IMDUR) 30 MG 24 hr tablet Take 1 tablet (30 mg total) by mouth daily.   metoprolol tartrate (LOPRESSOR) 25 MG tablet Take 0.5 tablets (12.5 mg total) by mouth 2 (two) times daily.   Multiple Vitamin tablet Take 1 tablet by mouth daily.   pantoprazole (PROTONIX) 40 MG tablet Take 1 tablet (40 mg total) by mouth daily.   simvastatin (ZOCOR) 40 MG tablet TAKE 1 TABLET BY MOUTH ONCE DAILY AT 6 P.M.   tapentadol (NUCYNTA) 100 MG 12 hr  tablet Take 100 mg by mouth 2 (two) times daily.    tiotropium (SPIRIVA HANDIHALER) 18 MCG inhalation capsule Place into inhaler and inhale as needed.   traMADol (ULTRAM) 50 MG tablet Take 50 mg by mouth as needed.     Allergies  Allergen Reactions   Codeine Nausea And Vomiting   Percocet [Oxycodone-Acetaminophen] Other (See Comments)    Stroke like symptoms    Social History   Socioeconomic History   Marital status: Divorced    Spouse name: Not on file   Number of children: Not on file   Years of education: Not on file   Highest education level: Not on file  Occupational History   Not on file  Tobacco Use    Smoking status: Every Day    Packs/day: 1.00    Years: 40.00    Pack years: 40.00    Types: Cigarettes   Smokeless tobacco: Never  Substance and Sexual Activity   Alcohol use: No   Drug use: No   Sexual activity: Yes    Birth control/protection: Surgical  Other Topics Concern   Not on file  Social History Narrative   Not on file   Social Determinants of Health   Financial Resource Strain: Not on file  Food Insecurity: Not on file  Transportation Needs: Not on file  Physical Activity: Not on file  Stress: Not on file  Social Connections: Not on file  Intimate Partner Violence: Not on file     Review of Systems: General: negative for chills, fever, night sweats or weight changes.  Cardiovascular: negative for chest pain, dyspnea on exertion, edema, orthopnea, palpitations, paroxysmal nocturnal dyspnea or shortness of breath Dermatological: negative for rash Respiratory: negative for cough or wheezing Urologic: negative for hematuria Abdominal: negative for nausea, vomiting, diarrhea, bright red blood per rectum, melena, or hematemesis Neurologic: negative for visual changes, syncope, or dizziness All other systems reviewed and are otherwise negative except as noted above.    Blood pressure (!) 140/94, pulse 73, height '5\' 1"'$  (1.549 m), weight 162 lb 3.2 oz (73.6 kg), SpO2 98 %.  General appearance: alert and no distress Neck: no adenopathy, no carotid bruit, no JVD, supple, symmetrical, trachea midline, and thyroid not enlarged, symmetric, no tenderness/mass/nodules Lungs: clear to auscultation bilaterally Heart: regular rate and rhythm, S1, S2 normal, no murmur, click, rub or gallop Extremities: extremities normal, atraumatic, no cyanosis or edema Pulses: 2+ and symmetric Skin: Skin color, texture, turgor normal. No rashes or lesions Neurologic: Grossly normal  EKG sinus rhythm at 73 without ST or T wave changes.  I personally reviewed this EKG.  ASSESSMENT AND  PLAN:   HYPERCHOLESTEROLEMIA History of hyperlipidemia on statin therapy with lipid profile performed 02/15/2021 revealing total cholesterol 128, LDL 55 and HDL 51.  Essential hypertension History of essential hypertension blood pressure measured today at 140/94 although she says she just recently took her blood pressure medicines which include hydrochlorothiazide and metoprolol.  Coronary atherosclerosis History of coronary artery disease status post LAD stenting by Dr. Rollene Fare August 2005.  Her last Myoview stress test performed 07/03/2013 was nonischemic.  She does have occasional chest pain which is not changed in frequency or severity.  Tobacco abuse History of ongoing tobacco abuse of 1 pack/day recalcitrant risk factor modification.     Lorretta Harp MD FACP,FACC,FAHA, [facility] 02/22/2021 10:46 AM

## 2021-02-22 NOTE — Assessment & Plan Note (Signed)
History of ongoing tobacco abuse of 1 pack/day recalcitrant risk factor modification. 

## 2021-02-22 NOTE — Patient Instructions (Signed)

## 2021-02-22 NOTE — Assessment & Plan Note (Signed)
History of hyperlipidemia on statin therapy with lipid profile performed 02/15/2021 revealing total cholesterol 128, LDL 55 and HDL 51.

## 2021-02-23 ENCOUNTER — Other Ambulatory Visit: Payer: Self-pay | Admitting: Cardiovascular Disease

## 2021-09-27 ENCOUNTER — Telehealth: Payer: Self-pay | Admitting: Cardiovascular Disease

## 2021-09-27 DIAGNOSIS — Z79899 Other long term (current) drug therapy: Secondary | ICD-10-CM

## 2021-09-27 NOTE — Telephone Encounter (Signed)
? ?  Pt requesting to send her a lab order so she can get her labs done at [facility] in [place]. ?

## 2021-09-28 NOTE — Telephone Encounter (Signed)
-  Pt verbalized understanding ?-Lab orders placed for July, 2023. Pt will have them completed prior to scheduled appointment.  ?

## 2022-02-21 LAB — LIPID PANEL
Chol/HDL Ratio: 2.8 ratio (ref 0.0–4.4)
Cholesterol, Total: 129 mg/dL (ref 100–199)
HDL: 46 mg/dL (ref >39)
LDL Chol Calc (NIH): 60 mg/dL (ref 0–99)
Triglycerides: 134 mg/dL (ref 0–149)
VLDL Cholesterol Cal: 23 mg/dL (ref 5–40)

## 2022-02-21 LAB — COMPREHENSIVE METABOLIC PANEL
ALT: 7 IU/L (ref 0–32)
AST: 16 IU/L (ref 0–40)
Albumin/Globulin Ratio: 1.7 (ref 1.2–2.2)
Albumin: 3.8 g/dL — ABNORMAL LOW (ref 3.9–4.9)
Alkaline Phosphatase: 126 IU/L — ABNORMAL HIGH (ref 44–121)
BUN/Creatinine Ratio: 10 — ABNORMAL LOW (ref 12–28)
BUN: 9 mg/dL (ref 8–27)
Bilirubin Total: 0.3 mg/dL (ref 0.0–1.2)
CO2: 27 mmol/L (ref 20–29)
Calcium: 9.1 mg/dL (ref 8.7–10.3)
Chloride: 101 mmol/L (ref 96–106)
Creatinine, Ser: 0.9 mg/dL (ref 0.57–1.00)
Globulin, Total: 2.3 g/dL (ref 1.5–4.5)
Glucose: 96 mg/dL (ref 70–99)
Potassium: 4.3 mmol/L (ref 3.5–5.2)
Sodium: 141 mmol/L (ref 134–144)
Total Protein: 6.1 g/dL (ref 6.0–8.5)
eGFR: 69 mL/min/{1.73_m2} (ref >59)

## 2022-02-25 ENCOUNTER — Other Ambulatory Visit: Payer: Self-pay | Admitting: Cardiovascular Disease

## 2022-02-27 ENCOUNTER — Ambulatory Visit: Payer: Medicare PPO | Admitting: Cardiovascular Disease

## 2022-02-27 ENCOUNTER — Encounter: Payer: Self-pay | Admitting: Cardiovascular Disease

## 2022-02-27 VITALS — BP 138/80 | HR 63 | Ht 61.0 in | Wt 143.8 lb

## 2022-02-27 DIAGNOSIS — I2510 Atherosclerotic heart disease of native coronary artery without angina pectoris: Secondary | ICD-10-CM | POA: Diagnosis not present

## 2022-02-27 DIAGNOSIS — I10 Essential (primary) hypertension: Secondary | ICD-10-CM

## 2022-02-27 DIAGNOSIS — E7800 Pure hypercholesterolemia, unspecified: Secondary | ICD-10-CM

## 2022-02-27 DIAGNOSIS — Z720 Tobacco use: Secondary | ICD-10-CM | POA: Diagnosis not present

## 2022-02-27 MED ORDER — NITROGLYCERIN 0.4 MG SL SUBL: 0.4000 mg | SUBLINGUAL_TABLET | SUBLINGUAL | 3 refills | Status: DC | PRN

## 2022-02-27 MED ORDER — ISOSORBIDE MONONITRATE ER 30 MG PO TB24: 30.0000 mg | ORAL_TABLET | Freq: Every day | ORAL | 3 refills | Status: DC

## 2022-02-27 MED ORDER — HYDROCHLOROTHIAZIDE 12.5 MG PO CAPS: ORAL_CAPSULE | ORAL | 0 refills | Status: DC

## 2022-02-27 MED ORDER — METOPROLOL TARTRATE 25 MG PO TABS: 12.5000 mg | ORAL_TABLET | Freq: Two times a day (BID) | ORAL | 3 refills | Status: DC

## 2022-02-27 MED ORDER — SIMVASTATIN 40 MG PO TABS: ORAL_TABLET | ORAL | 3 refills | Status: DC

## 2022-02-27 NOTE — Addendum Note (Signed)
Addended by: Rexanne Mano B on: 02/27/2022 10:55 AM   Modules accepted: Orders

## 2022-02-27 NOTE — Progress Notes (Signed)
02/27/2022 [name]   21-Sep-1950  [id number]  Primary Physician Kateri Mc, MD Primary Cardiologist: Lorretta Harp MD FACP, [place], [place], [place]  HPI:  [name] is a 71 y.o.  thin appearing white female who I last saw in the office 02/22/2021.Marland Kitchen She has a history of coronary artery disease with LAD stenting by Dr. Rollene Fare in August of 2005 she had moderate disease in her nondominant right. Just has mild sleep apnea, gastroesophageal reflux disease, and continued tobacco use. Her sister has liver cancer and has extremely poor prognosis and is entering a hospice facility. The patient is having a difficult time with this grief process. She is on Lexapro. Over the last month she's had at least 12 episodes of chest discomfort described as a pressure she just feels a bulge in her chest, she cries, it resolves itself eventually. It will radiate down the left arm at times. She has not tried nitroglycerin sublingual. She has mild shortness of breath when it occurs but no nausea no diaphoresis. She also relates she's unable to sleep at night. She had a Myoview performed April of 2013 which was negative for ischemia.Patient also has chronic back pain and is on I nerve stimulator for pain control.   Her hypertension and hypercholesterolemia are controlled and treated. She was seen by Cecilie Kicks registered nurse practitioner on 07/01/13 with accelerated chest pain. She had been under a lot of stress because of of her younger sister being terminally ill.. A Myoview stress test was entirely normal. She was placed on low-dose all in all acting nitrates as well as Xanax. ince I saw her a year ago she started clinically well. She has become anemic with hemoglobins in the 7 range. I have told her to stop Plavix.   She had some AVMs cauterized and her hemoglobin returned to baseline.   Since I saw her a year ago she continues to do well.  She does get occasional chest pain which has not changed in  frequency or severity.  She continues to smoke 1 pack/day.     Current Meds  Medication Sig   albuterol (VENTOLIN HFA) 108 (90 Base) MCG/ACT inhaler Inhale 2 puffs into the lungs every 6 (six) hours as needed.   ammonium lactate (LAC-HYDRIN) 12 % lotion ammonium lactate 12 % lotion  apply TWICE DAILY TO ARMS AND HANDS TO INCREASE moisture AND reduce easy brusing. IT can also be used safely ON NECK, CHEST, AND LEGS IF desired. FACIAL USE is fine but THE scent can be bothersome.   aspirin 81 MG tablet Take 81 mg by mouth 3 (three) times a week.    escitalopram (LEXAPRO) 10 MG tablet Take 20 mg by mouth daily.   folic acid (FOLVITE) 193 MCG tablet Take 400 mcg by mouth daily.   gabapentin (NEURONTIN) 100 MG capsule Take 100 mg by mouth 2 (two) times daily.   hydrochlorothiazide (MICROZIDE) 12.5 MG capsule AS NEEDED FOR SWELLING   isosorbide mononitrate (IMDUR) 30 MG 24 hr tablet TAKE 1 TABLET BY MOUTH ONCE DAILY   metoprolol tartrate (LOPRESSOR) 25 MG tablet TAKE 1/2 TABLET BY MOUTH TWICE A DAY   Multiple Vitamin tablet Take 1 tablet by mouth daily.   nitroGLYCERIN (NITROSTAT) 0.4 MG SL tablet Place 1 tablet (0.4 mg total) under the tongue every 5 (five) minutes as needed for chest pain.   pantoprazole (PROTONIX) 40 MG tablet Take 1 tablet (40 mg total) by mouth daily.   simvastatin (  ZOCOR) 40 MG tablet TAKE 1 TABLET BY MOUTH ONCE DAILY AT 6 P.M.   tapentadol (NUCYNTA) 100 MG 12 hr tablet Take 100 mg by mouth 2 (two) times daily.    tiotropium (SPIRIVA HANDIHALER) 18 MCG inhalation capsule Place into inhaler and inhale as needed.   traMADol (ULTRAM) 50 MG tablet Take 50 mg by mouth as needed.     Allergies  Allergen Reactions   Codeine Nausea And Vomiting   Percocet [Oxycodone-Acetaminophen] Other (See Comments)    Stroke like symptoms    Social History   Socioeconomic History   Marital status: Divorced    Spouse name: Not on file   Number of children: Not on file   Years of  education: Not on file   Highest education level: Not on file  Occupational History   Not on file  Tobacco Use   Smoking status: Every Day    Packs/day: 1.00    Years: 40.00    Total pack years: 40.00    Types: Cigarettes   Smokeless tobacco: Never  Substance and Sexual Activity   Alcohol use: No   Drug use: No   Sexual activity: Yes    Birth control/protection: Surgical  Other Topics Concern   Not on file  Social History Narrative   Not on file   Social Determinants of Health   Financial Resource Strain: Not on file  Food Insecurity: Not on file  Transportation Needs: Not on file  Physical Activity: Not on file  Stress: Not on file  Social Connections: Not on file  Intimate Partner Violence: Not on file     Review of Systems: General: negative for chills, fever, night sweats or weight changes.  Cardiovascular: negative for chest pain, dyspnea on exertion, edema, orthopnea, palpitations, paroxysmal nocturnal dyspnea or shortness of breath Dermatological: negative for rash Respiratory: negative for cough or wheezing Urologic: negative for hematuria Abdominal: negative for nausea, vomiting, diarrhea, bright red blood per rectum, melena, or hematemesis Neurologic: negative for visual changes, syncope, or dizziness All other systems reviewed and are otherwise negative except as noted above.    Blood pressure 138/80, pulse 63, height '5\' 1"'$  (1.549 m), weight 143 lb 12.8 oz (65.2 kg), SpO2 90 %.  General appearance: alert and no distress Neck: no adenopathy, no carotid bruit, no JVD, supple, symmetrical, trachea midline, and thyroid not enlarged, symmetric, no tenderness/mass/nodules Lungs: clear to auscultation bilaterally Heart: regular rate and rhythm, S1, S2 normal, no murmur, click, rub or gallop Extremities: extremities normal, atraumatic, no cyanosis or edema Pulses: 2+ and symmetric Skin: Skin color, texture, turgor normal. No rashes or lesions Neurologic: Grossly  normal  EKG sinus rhythm at 63 without ST or T wave changes.  I personally reviewed this EKG.  ASSESSMENT AND PLAN:   HYPERCHOLESTEROLEMIA History of hyperlipidemia on statin therapy with lipid profile performed 02/20/2022 revealing total cholesterol 129, LDL 60 and HDL of 46.  Essential hypertension History of essential hypertension blood pressure measured today at 138/80.  She is on hydrochlorothiazide and metoprolol.  Coronary atherosclerosis History of CAD status post LAD stenting by Dr. Rollene Fare August 2005 with moderate disease in a nondominant right.  She had multiple negative Myoview stress test since that time.  She currently denies chest pain or shortness of breath.  Tobacco abuse Ongoing tobacco abuse of 1 pack/day recalcitrant to risk factor modification.     Lorretta Harp MD FACP,FACC,FAHA, [facility] 02/27/2022 10:51 AM

## 2022-02-27 NOTE — Assessment & Plan Note (Signed)
History of CAD status post LAD stenting by Dr. Rollene Fare August 2005 with moderate disease in a nondominant right.  She had multiple negative Myoview stress test since that time.  She currently denies chest pain or shortness of breath.

## 2022-02-27 NOTE — Assessment & Plan Note (Signed)
History of hyperlipidemia on statin therapy with lipid profile performed 02/20/2022 revealing total cholesterol 129, LDL 60 and HDL of 46.

## 2022-02-27 NOTE — Patient Instructions (Signed)
Medication Instructions:  No Changes In Medications at this time.  *If you need a refill on your cardiac medications before your next appointment, please call your pharmacy*  Lab Work: None Ordered At This Time.  If you have labs (blood work) drawn today and your tests are completely normal, you will receive your results only by: [place] (if you have MyChart) OR A paper copy in the mail If you have any lab test that is abnormal or we need to change your treatment, we will call you to review the results.  Testing/Procedures: None Ordered At This Time.   Follow-Up: At [facility], you and your health needs are our priority.  As part of our continuing mission to provide you with exceptional heart care, we have created designated Provider Care Teams.  These Care Teams include your primary Cardiologist (physician) and Advanced Practice Providers (APPs -  Physician Assistants and Nurse Practitioners) who all work together to provide you with the care you need, when you need it.  We recommend signing up for the patient portal called "MyChart".  Sign up information is provided on this After Visit Summary.  MyChart is used to connect with patients for Virtual Visits (Telemedicine).  Patients are able to view lab/test results, encounter notes, upcoming appointments, etc.  Non-urgent messages can be sent to your provider as well.   To learn more about what you can do with MyChart, go to [email or web address].    Your next appointment:   1 year(s)  The format for your next appointment:   In Person  Provider: Dr. Gwenlyn Found

## 2022-02-27 NOTE — Assessment & Plan Note (Signed)
History of essential hypertension blood pressure measured today at 138/80.  She is on hydrochlorothiazide and metoprolol.

## 2022-02-27 NOTE — Assessment & Plan Note (Signed)
Ongoing tobacco abuse of 1 pack/day recalcitrant to risk factor modification.

## 2022-04-18 ENCOUNTER — Telehealth: Payer: Self-pay | Admitting: Cardiovascular Disease

## 2022-04-18 ENCOUNTER — Encounter: Payer: Self-pay | Admitting: Cardiovascular Disease

## 2022-04-18 NOTE — Telephone Encounter (Signed)
Patient reports that 2 days ago, she awoke with left arm numbness, no tingling. Yesterday, while eating out, she started to shiver and stagger. She "blacked out and fell", no head injury, someone kept her head from hitting the floor.  At present, she is sweating and nauseated, with slight headache. BP 113/69, P 85, sat 94%. No visual disturbances or numbness in arm. There have not been any changes in her habits. Dr. Gwenlyn Found advised and recommended patient go to the ED. Informed patient to have someone take her to the ED for evaluation due to arm numbness and syncope. She stated she will go to [facility].

## 2022-04-18 NOTE — Telephone Encounter (Signed)
Pt c/o Syncope: STAT if syncope occurred within 30 minutes and pt complains of lightheadedness High Priority if episode of passing out, completely, today or in last 24 hours   Did you pass out today? No   When is the last time you passed out? Yesterday   Has this occurred multiple times? No   Did you have any symptoms prior to passing out?   Patient stated she was cold, shivering which lasted about 30 minutes.    Patient stated she passed out yesterday at a restaurant and two days ago her left arm was numb.  Patient stated she is slightly nauseous and sweating.

## 2022-11-26 ENCOUNTER — Telehealth: Payer: Self-pay | Admitting: Cardiovascular Disease

## 2022-11-26 DIAGNOSIS — E7800 Pure hypercholesterolemia, unspecified: Secondary | ICD-10-CM

## 2022-11-26 NOTE — Telephone Encounter (Signed)
Patient is requesting orders to have lab work about 1 week prior to 8/02 appointment with Edd Fabian. She states she plans on going to [facility] in [place].

## 2022-11-26 NOTE — Telephone Encounter (Signed)
Kristy Gess, MD  Cv Div Nl Crista Luria minutes ago (12:12 PM)    Prob FLP    Returned call to patient and made her aware that labs have been ordered.  Advised patient to call back to office with any issues, questions, or concerns. Patient verbalized understanding.

## 2022-11-26 NOTE — Telephone Encounter (Signed)
Please advise which labs to be ordered for patient OV in August , thank you

## 2023-02-21 ENCOUNTER — Other Ambulatory Visit: Payer: Self-pay | Admitting: Cardiovascular Disease

## 2023-02-28 NOTE — Progress Notes (Signed)
Cardiology Office Note:  .   Date:  03/01/2023  ID:  [name], DOB 1950/12/13, MRN [id number] PCP: Regino Bellow, MD  [facility] Providers Cardiologist:  Nanetta Batty, MD    History of Present Illness: .   [name] is a 72 y.o. female with past medical history of coronary artery disease with LAD stenting by Dr. Alanda Amass in August, 2005, she had moderate disease in her RCA, OSA, HTN, HLD, COPD, GI reflux, chronic back pain with nerve stimulator and tobacco abuse.   In 2014 she was seen by cardiology for chest pain. At that time she reported that her sister had been placed in hospice and it was causing significant stress. She was started on low dose nitrates at that time with improvement in her chest pain. Myoview at that time was low risk, with small fixed apical defect, noted could be scar.   She was followed yearly by Dr. Allyson Sabal and remained stable from a cardiac standpoint. In 2022 she reported occasional chest pain that was unchanged in frequency or severity.   On 04/18/22 she notified the office of a syncopal episode associated with nausea and sweating. It was recommended she report to the emergency room for evaluation, presented to primary care and had normal workup per patient. Felt it was related to weight loss.   Today she presents for follow-up for CAD.  She reports that she has been doing very well overall.  She notes intermittent atypical chest pain that she has had for multiple years.  Will occur at random and lasts for a few seconds, is not associated with exertion.  She notes she has had this pain even before her stenting in 2005.  Denies chest pain with exertion or changes with her breathing.  She has had no further syncope or presyncopal symptoms.  Notes concern for increased LDL to 72 on most recent lipid panel, reports that she has been drinking an Ensure daily.  Labs 08/23/22 from [facility]: Hemoglobin 13.3, hematocrit 42.4, sodium 143, potassium 4.1, AST  21, ALT 11, creatinine 0.78   ROS: Today she denies chest pain, shortness of breath, lower extremity edema, palpitations, melena, hematuria, hemoptysis, diaphoresis, weakness, presyncope, syncope, orthopnea, and PND. All other systems reviewed and are otherwise negative except as noted above.    Studies Reviewed: Marland Kitchen   EKG Interpretation Date/Time:  Friday March 01 2023 10:26:28 EDT Ventricular Rate:  63 PR Interval:  166 QRS Duration:  90 QT Interval:  424 QTC Calculation: 433 R Axis:   -27  Text Interpretation: Normal sinus rhythm No acute changes Confirmed by Reather Littler [phone]) on 03/01/2023 12:06:13 PM   Nuclear stress 07/03/2013: Impression Exercise Capacity:  Lexiscan with no exercise. BP Response:  Normal blood pressure response. Clinical Symptoms:  Dyspnea, headache ECG Impression:  No significant ECG changes with Lexiscan. Comparison with Prior Nuclear Study: No significant change from previous study Overall Impression:  Low risk stress nuclear study with small fixed apical defect which could be scar.. LV Wall Motion:  NL LV Function; NL Wall Motion; EF 64%  Cardiac Catheterization 03/20/04 - LAD stenting, noted to have moderate disease in her RCA  Physical Exam:   VS:  BP 116/76 (BP Location: Left Arm, Patient Position: Sitting, Cuff Size: Normal)   Pulse 63   Ht 5\' 1"  (1.549 m)   Wt 140 lb 6.4 oz (63.7 kg)   SpO2 95%   BMI 26.53 kg/m    Wt Readings from Last 3 Encounters:  03/01/23 140 lb 6.4 oz (63.7 kg)  02/27/22 143 lb 12.8 oz (65.2 kg)  02/22/21 162 lb 3.2 oz (73.6 kg)    GEN: Well nourished, well developed in no acute distress NECK: No JVD; No carotid bruits CARDIAC: RRR, no murmurs, rubs, gallops RESPIRATORY:  Clear to auscultation without rales, wheezing or rhonchi  ABDOMEN: Soft, non-tender, non-distended EXTREMITIES:  No edema; No deformity   ASSESSMENT AND PLAN: .    CAD: LAD stenting by Dr. Alanda Amass in August 2005. She was noted to have moderate  disease in her RCA.  She reports atypical chest pain that she has had for many years, denies exertional chest pain.  No indication for further ischemic workup at this time. Heart healthy diet and regular cardiovascular exercise encouraged.  Reviewed ED precautions.  Continue aspirin, Imdur, metoprolol and simvastatin, continue HCTZ as needed for swelling. Refills sent to her home pharmacy.   Hyperlipidemia: Last lipid panel on 02/21/23 indicated total cholesterol of 158 and LDL of 72.  Previously reviewed by Dr. Allyson Sabal, no changes at this time.  Patient and I discussed, she will reduce intake of Ensure and monitor her diet. Continue simvastatin 40mg  daily.   Hypertension: Blood pressure well-controlled today at 116/76. Continue Imdur, metoprolol and as needed hydrochlorothiazide.  Syncope: On chart review patient notified the office of a syncopal event on 04/18/22, it was recommended she have evaluation in the emergency room.  She follow-up with her primary care per patient was attributed to weight loss.  She denies any presyncope or syncope.  Reviewed ED precautions.   COPD/Smoking: Currently smoking 2 packs a day.  She is not currently interested in cessation.  Primary care manages COPD, denies changes in her breathing.   Sleep disordered breathing: She notes occasional daytime fatigue and poor sleep.  She does snore at night discussed sleep study.  She defers at this time.       Dispo: Follow-up with Dr. Allyson Sabal in 1 year.  Signed, Rip Harbour, NP

## 2023-03-01 ENCOUNTER — Telehealth: Payer: Self-pay | Admitting: Cardiovascular Disease

## 2023-03-01 ENCOUNTER — Ambulatory Visit: Payer: Medicare PPO | Attending: General Practice | Admitting: Cardiology

## 2023-03-01 ENCOUNTER — Encounter: Payer: Self-pay | Admitting: General Practice

## 2023-03-01 VITALS — BP 116/76 | HR 63 | Ht 61.0 in | Wt 140.4 lb

## 2023-03-01 DIAGNOSIS — I2510 Atherosclerotic heart disease of native coronary artery without angina pectoris: Secondary | ICD-10-CM | POA: Diagnosis not present

## 2023-03-01 DIAGNOSIS — I10 Essential (primary) hypertension: Secondary | ICD-10-CM | POA: Diagnosis not present

## 2023-03-01 DIAGNOSIS — Z720 Tobacco use: Secondary | ICD-10-CM | POA: Diagnosis not present

## 2023-03-01 DIAGNOSIS — E7800 Pure hypercholesterolemia, unspecified: Secondary | ICD-10-CM | POA: Diagnosis not present

## 2023-03-01 DIAGNOSIS — J449 Chronic obstructive pulmonary disease, unspecified: Secondary | ICD-10-CM

## 2023-03-01 DIAGNOSIS — G4730 Sleep apnea, unspecified: Secondary | ICD-10-CM

## 2023-03-01 DIAGNOSIS — Z79899 Other long term (current) drug therapy: Secondary | ICD-10-CM

## 2023-03-01 MED ORDER — HYDROCHLOROTHIAZIDE 12.5 MG PO CAPS: 12.5000 mg | ORAL_CAPSULE | Freq: Every day | ORAL | Status: DC

## 2023-03-01 MED ORDER — SIMVASTATIN 40 MG PO TABS: ORAL_TABLET | ORAL | 3 refills | Status: DC

## 2023-03-01 MED ORDER — HYDROCHLOROTHIAZIDE 12.5 MG PO CAPS: ORAL_CAPSULE | ORAL | Status: DC

## 2023-03-01 MED ORDER — METOPROLOL TARTRATE 25 MG PO TABS: 12.5000 mg | ORAL_TABLET | Freq: Two times a day (BID) | ORAL | 3 refills | Status: DC

## 2023-03-01 MED ORDER — ISOSORBIDE MONONITRATE ER 30 MG PO TB24: 30.0000 mg | ORAL_TABLET | Freq: Every day | ORAL | 3 refills | Status: DC

## 2023-03-01 MED ORDER — NITROGLYCERIN 0.4 MG SL SUBL: 0.4000 mg | SUBLINGUAL_TABLET | SUBLINGUAL | 1 refills | Status: AC | PRN

## 2023-03-01 NOTE — Telephone Encounter (Signed)
Sent to pt's provider and nurse.

## 2023-03-01 NOTE — Telephone Encounter (Signed)
Pt c/o medication issue:  1. Name of Medication:   hydrochlorothiazide (MICROZIDE) 12.5 MG capsule    2. How are you currently taking this medication (dosage and times per day)?   AS NEEDED FOR SWELLING    3. Are you having a reaction (difficulty breathing--STAT)? No  4. What is your medication issue? Pharmacy calling to state they need the full instructions for the above medication. Please advise.

## 2023-03-01 NOTE — Telephone Encounter (Signed)
Rx sent to pharmacy. Left voicemail to return call to office

## 2023-03-01 NOTE — Patient Instructions (Signed)
Medication Instructions:  The current medical regimen is effective;  continue present plan and medications as directed. Please refer to the Current Medication list given to you today. *If you need a refill on your cardiac medications before your next appointment, please call your pharmacy*  Lab Work: NONE  Testing/Procedures: NONE  Follow-Up: At [facility], you and your health needs are our priority.  As part of our continuing mission to provide you with exceptional heart care, we have created designated Provider Care Teams.  These Care Teams include your primary Cardiologist (physician) and Advanced Practice Providers (APPs -  Physician Assistants and Nurse Practitioners) who all work together to provide you with the care you need, when you need it.  Your next appointment:   12 month(s)  Provider:   Nanetta Batty, MD  or Edd Fabian, FNP or [facility]      Other Instructions

## 2023-03-01 NOTE — Telephone Encounter (Signed)
New rx sent to pharmacy

## 2023-03-01 NOTE — Telephone Encounter (Signed)
Pt c/o medication issue:  1. Name of Medication: hydrochlorothiazide (MICROZIDE) 12.5 MG capsule   2. How are you currently taking this medication (dosage and times per day)? AS NEEDED FOR SWELLING   3. Are you having a reaction (difficulty breathing--STAT)? No  4. What is your medication issue? [facility] is calling stating that she needs the prescription updated with more instructions than "as needed for swelling". Pharmacy gave a phone number of [phone]. Please advise.

## 2023-03-01 NOTE — Telephone Encounter (Signed)
Left voicemail to return call to office.   Can you please just let her I know I sent over prescription. Thank you

## 2023-03-01 NOTE — Telephone Encounter (Signed)
Patient returned staff call. 

## 2023-03-01 NOTE — Telephone Encounter (Signed)
Patient is returning call. Gave message from [facility], LPN that the prescription was called into [facility] pharmacy. Patient appreciative of information.

## 2024-02-05 ENCOUNTER — Telehealth: Payer: Self-pay | Admitting: Cardiovascular Disease

## 2024-02-05 DIAGNOSIS — E7800 Pure hypercholesterolemia, unspecified: Secondary | ICD-10-CM

## 2024-02-05 NOTE — Telephone Encounter (Signed)
 Pt c/o swelling/edema: STAT if pt has developed SOB within 24 hours  If swelling, where is the swelling located?   Ankles and left leg to knee  How much weight have you gained and in what time span?   yes  Have you gained 2 pounds in a day or 5 pounds in a week?   Yes  Do you have a log of your daily weights (if so, list)?   No  Are you currently taking a fluid pill?   No  Are you currently SOB?    No  Have you traveled recently in a car or plane for an extended period of time?   Patient stated she is being treated for lung cancer by Dr.Chinnasami at [facility] and she has developed swelling in her ankles and left leg.  Patient wants to be prescribed a fluid pill.  Patient noted she will be in treatment tomorrow (7/10) from 10:00 am to 2:15 pm.

## 2024-02-05 NOTE — Telephone Encounter (Signed)
 Swelling in both legs all the way up to the knee. She is not short of breath, just concerned about the increased swelling. She would like a fluid pill sent in for her if possible. Informed her that providers usually want to see you in office before they will prescribe fluid pills.  She reports that she is very tied up with her Chemo and Radiation treatments and it is hard to get to other appointments. She asked if I can just send this to Dr Court to see if he would be willing to prescribe something or send a message to her Oncologist that it would be ok for them to prescribe it to her.  I asked about her PCP- she said they would not prescribe it, they would end up telling her to go to her cardiologist.   Informed her that I would send the information to her provider. She has a scheduled appt with Dr Court 03/03/24- She will be done with treatments 02/28/24.

## 2024-02-06 NOTE — Telephone Encounter (Signed)
 Spoke with patient and shared response from Dr. Court:  I have not seen her in the office in over 2 years. I do not feel comfortable prescribing a diuretic without seeing her first and assessing the etiology of the swelling.   Patient states she saw Katlyn West, NP in August and asked if she could address the swelling and prescribe a diuretic. Informed patient I was not sure if she would be able to without having seen her for almost a year, but I will forward this message to her to see how she felt about this.  Moved patient's appt up with Dr. Court to 02/26/24. Patient expressed appreciation for call.

## 2024-02-21 ENCOUNTER — Ambulatory Visit: Payer: Self-pay | Admitting: Cardiovascular Disease

## 2024-02-21 LAB — LIPID PANEL
Chol/HDL Ratio: 2.1 ratio (ref 0.0–4.4)
Cholesterol, Total: 107 mg/dL (ref 100–199)
HDL: 52 mg/dL (ref >39)
LDL Chol Calc (NIH): 36 mg/dL (ref 0–99)
Triglycerides: 105 mg/dL (ref 0–149)
VLDL Cholesterol Cal: 19 mg/dL (ref 5–40)

## 2024-02-26 ENCOUNTER — Encounter: Payer: Self-pay | Admitting: Cardiovascular Disease

## 2024-02-26 ENCOUNTER — Ambulatory Visit: Attending: Cardiology | Admitting: Cardiovascular Disease

## 2024-02-26 VITALS — BP 110/56 | HR 70 | Ht 61.0 in | Wt 152.0 lb

## 2024-02-26 DIAGNOSIS — I10 Essential (primary) hypertension: Secondary | ICD-10-CM

## 2024-02-26 DIAGNOSIS — E7800 Pure hypercholesterolemia, unspecified: Secondary | ICD-10-CM | POA: Diagnosis not present

## 2024-02-26 DIAGNOSIS — R0989 Other specified symptoms and signs involving the circulatory and respiratory systems: Secondary | ICD-10-CM

## 2024-02-26 DIAGNOSIS — I2510 Atherosclerotic heart disease of native coronary artery without angina pectoris: Secondary | ICD-10-CM | POA: Diagnosis not present

## 2024-02-26 DIAGNOSIS — Z720 Tobacco use: Secondary | ICD-10-CM | POA: Diagnosis not present

## 2024-02-26 DIAGNOSIS — R600 Localized edema: Secondary | ICD-10-CM | POA: Insufficient documentation

## 2024-02-26 MED ORDER — POTASSIUM CHLORIDE ER 10 MEQ PO TBCR: 10.0000 meq | EXTENDED_RELEASE_TABLET | Freq: Every day | ORAL | 4 refills | Status: AC

## 2024-02-26 MED ORDER — FUROSEMIDE 20 MG PO TABS: 20.0000 mg | ORAL_TABLET | Freq: Every day | ORAL | 4 refills | Status: AC

## 2024-02-26 NOTE — Assessment & Plan Note (Signed)
 History of hyperlipidemia on statin therapy lipid profile performed 02/16/2024 revealed a total cholesterol of 107, LDL 36 and HDL of 52.

## 2024-02-26 NOTE — Assessment & Plan Note (Signed)
 History of CAD status post LAD stenting by Dr. Maye August 2005 with moderate disease in her RCA.  She gets occasional atypical chest pain.

## 2024-02-26 NOTE — Assessment & Plan Note (Signed)
 History of essential hypertension with blood pressure measured today 110/56.  She is on hydrochlorothiazide  12.5 mg a day and metoprolol .

## 2024-02-26 NOTE — Assessment & Plan Note (Signed)
 Ongoing tobacco abuse of 1/2 pack a day recalcitrant to risk factor modification despite a recent diagnosis of lung cancer.

## 2024-02-26 NOTE — Patient Instructions (Signed)
 Medication Instructions:  Your physician has recommended you make the following change in your medication:   -Start furosemide  (lasix ) 20mg  once daily.  -Start potassium chloride  (K-Dur) 10meq once daily.   *If you need a refill on your cardiac medications before your next appointment, please call your pharmacy*  Lab Work: Your physician recommends that you return for lab work in: 7-10 days for BMET  If you have labs (blood work) drawn today and your tests are completely normal, you will receive your results only by: MyChart Message (if you have MyChart) OR A paper copy in the mail If you have any lab test that is abnormal or we need to change your treatment, we will call you to review the results.  Testing/Procedures: Your physician has requested that you have a carotid duplex. This test is an ultrasound of the carotid arteries in your neck. It looks at blood flow through these arteries that supply the brain with blood. Allow one hour for this exam. There are no restrictions or special instructions. This will take place at [address], 4th floor  Please note: We ask at that you not bring children with you during ultrasound (echo/ vascular) testing. Due to room size and safety concerns, children are not allowed in the ultrasound rooms during exams. Our front office staff cannot provide observation of children in our lobby area while testing is being conducted. An adult accompanying a patient to their appointment will only be allowed in the ultrasound room at the discretion of the ultrasound technician under special circumstances. We apologize for any inconvenience.   Your physician has requested that you have an echocardiogram. Echocardiography is a painless test that uses sound waves to create images of your heart. It provides your doctor with information about the size and shape of your heart and how well your heart's chambers and valves are working. This procedure takes approximately  one hour. There are no restrictions for this procedure. Please do NOT wear cologne, perfume, aftershave, or lotions (deodorant is allowed). Please arrive 15 minutes prior to your appointment time.  Please note: We ask at that you not bring children with you during ultrasound (echo/ vascular) testing. Due to room size and safety concerns, children are not allowed in the ultrasound rooms during exams. Our front office staff cannot provide observation of children in our lobby area while testing is being conducted. An adult accompanying a patient to their appointment will only be allowed in the ultrasound room at the discretion of the ultrasound technician under special circumstances. We apologize for any inconvenience.   Follow-Up: At [facility], you and your health needs are our priority.  As part of our continuing mission to provide you with exceptional heart care, our providers are all part of one team.  This team includes your primary Cardiologist (physician) and Advanced Practice Providers or APPs (Physician Assistants and Nurse Practitioners) who all work together to provide you with the care you need, when you need it.  Your next appointment:   3 month(s)  Provider:   Jon Hails, PA-C, Callie Goodrich, PA-C, Kathleen Johnson, PA-C, Hao Meng, PA-C, or Damien Braver, NP         Then, Dorn Lesches, MD will plan to see you again in 6 month(s).    We recommend signing up for the patient portal called MyChart.  Sign up information is provided on this After Visit Summary.  MyChart is used to connect with patients for Virtual Visits (Telemedicine).  Patients are  able to view lab/test results, encounter notes, upcoming appointments, etc.  Non-urgent messages can be sent to your provider as well.   To learn more about what you can do with MyChart, go to [email or web address].

## 2024-02-26 NOTE — Assessment & Plan Note (Signed)
 Patient has noticed bilateral lower extremity edema left greater than right worse at the end of the day.  Go to get a 2D echocardiogram and begin her on low-dose furosemide  and potassium repletion.

## 2024-02-26 NOTE — Progress Notes (Signed)
 02/26/2024 [name]   [birth year]  [id number]  Primary Physician Bonner Cindia MATSU, MD Primary Cardiologist: Dorn JINNY Lesches MD FACP, [place], [place], FSCAI  HPI:  [name] is a 73 y.o.  thin appearing white female who I last saw in the office 02/27/2022.[name]  She is is accompanied by her sister Larry today.  She has a history of coronary artery disease with LAD stenting by Dr. Maye in August of 2005 she had moderate disease in her nondominant right. Just has mild sleep apnea, gastroesophageal reflux disease, and continued tobacco use. Her sister has liver cancer and has extremely poor prognosis and is entering a hospice facility. The patient is having a difficult time with this grief process. She is on Lexapro . Over the last month she's had at least 12 episodes of chest discomfort described as a pressure she just feels a bulge in her chest, she cries, it resolves itself eventually. It will radiate down the left arm at times. She has not tried nitroglycerin  sublingual. She has mild shortness of breath when it occurs but no nausea no diaphoresis. She also relates she's unable to sleep at night. She had a Myoview performed April of 2013 which was negative for ischemia.Patient also has chronic back pain and is on I nerve stimulator for pain control.   Her hypertension and hypercholesterolemia are controlled and treated. She was seen by Leita Lobstein registered nurse practitioner on 07/01/13 with accelerated chest pain. She had been under a lot of stress because of of her younger sister being terminally ill.. A Myoview stress test was entirely normal. She was placed on low-dose all in all acting nitrates as well as Xanax . ince I saw her a year ago she started clinically well. She has become anemic with hemoglobins in the 7 range. I have told her to stop Plavix .   She had some AVMs cauterized and her hemoglobin returned to baseline.   Since I saw her in the office 2 years ago she unfortunately  has recently been diagnosed with lung cancer back in April and is undergoing chemotherapy and radiation therapy.  She does however continue to smoke 1/2 pack/day.  She is still gets some atypical chest pain which has not changed in frequency or severity.  She has complained of increasing left greater than right lower extremity edema for unclear reasons.   Current Meds  Medication Sig   albuterol (VENTOLIN HFA) 108 (90 Base) MCG/ACT inhaler Inhale 2 puffs into the lungs every 6 (six) hours as needed.   ammonium lactate (LAC-HYDRIN) 12 % lotion ammonium lactate 12 % lotion  apply TWICE DAILY TO ARMS AND HANDS TO INCREASE moisture AND reduce easy brusing. IT can also be used safely ON NECK, CHEST, AND LEGS IF desired. FACIAL USE is fine but THE scent can be bothersome.   aspirin 81 MG tablet Take 81 mg by mouth 3 (three) times a week.    escitalopram  (LEXAPRO ) 10 MG tablet Take 20 mg by mouth daily.   folic acid (FOLVITE) 800 MCG tablet Take 400 mcg by mouth daily.   gabapentin  (NEURONTIN ) 100 MG capsule Take 100 mg by mouth 2 (two) times daily.   hydrochlorothiazide  (MICROZIDE ) 12.5 MG capsule Take 1 capsule (12.5 mg total) by mouth daily. AS NEEDED FOR SWELLING   isosorbide  mononitrate (IMDUR ) 30 MG 24 hr tablet Take 1 tablet (30 mg total) by mouth daily.   metoprolol  tartrate (LOPRESSOR ) 25 MG tablet Take 0.5 tablets (12.5 mg total) by  mouth 2 (two) times daily.   Multiple Vitamin tablet Take 1 tablet by mouth daily.   nitroGLYCERIN  (NITROSTAT ) 0.4 MG SL tablet Place 1 tablet (0.4 mg total) under the tongue every 5 (five) minutes as needed for chest pain.   pantoprazole  (PROTONIX ) 40 MG tablet Take 1 tablet (40 mg total) by mouth daily.   simvastatin  (ZOCOR ) 40 MG tablet TAKE 1 TABLET BY MOUTH ONCE DAILY AT 6 P.M.   tapentadol (NUCYNTA) 100 MG 12 hr tablet Take 100 mg by mouth 2 (two) times daily.    traMADol (ULTRAM) 50 MG tablet Take 50 mg by mouth as needed.     Allergies  Allergen  Reactions   Codeine Nausea And Vomiting   Percocet [Oxycodone -Acetaminophen ] Other (See Comments)    Stroke like symptoms    Social History   Socioeconomic History   Marital status: Divorced    Spouse name: Not on file   Number of children: Not on file   Years of education: Not on file   Highest education level: Not on file  Occupational History   Not on file  Tobacco Use   Smoking status: Every Day    Current packs/day: 1.00    Average packs/day: 1 pack/day for 40.0 years (40.0 ttl pk-yrs)    Types: Cigarettes   Smokeless tobacco: Never  Substance and Sexual Activity   Alcohol use: No   Drug use: No   Sexual activity: Yes    Birth control/protection: Surgical  Other Topics Concern   Not on file  Social History Narrative   Not on file   Social Drivers of Health   Financial Resource Strain: Low Risk  (10/18/2023)   Received from [facility]   Overall Financial Resource Strain (CARDIA)    Difficulty of Paying Living Expenses: Not hard at all  Food Insecurity: No Food Insecurity (10/18/2023)   Received from [facility]   Hunger Vital Sign    Within the past 12 months, you worried that your food would run out before you got the money to buy more.: Never true    Within the past 12 months, the food you bought just didn't last and you didn't have money to get more.: Never true  Transportation Needs: No Transportation Needs (10/18/2023)   Received from [facility] - Transportation    Lack of Transportation (Medical): No    Lack of Transportation (Non-Medical): No  Physical Activity: Sufficiently Active (10/18/2023)   Received from [facility]   Exercise Vital Sign    On average, how many days per week do you engage in moderate to strenuous exercise (like a brisk walk)?: 5 days    On average, how many minutes do you engage in exercise at this level?: 30 min  Stress: No Stress Concern Present (12/05/2023)   Received from [facility] of  Occupational Health - Occupational Stress Questionnaire    Feeling of Stress : Only a little  Social Connections: Socially Integrated (10/18/2023)   Received from [facility]   Social Network    How would you rate your social network (family, work, friends)?: Good participation with social networks  Intimate Partner Violence: Not At Risk (10/18/2023)   Received from [facility]   HITS    Over the last 12 months how often did your partner physically hurt you?: Never    Over the last 12 months how often did your partner insult you or talk down to you?: Never  Over the last 12 months how often did your partner threaten you with physical harm?: Never    Over the last 12 months how often did your partner scream or curse at you?: Never     Review of Systems: General: negative for chills, fever, night sweats or weight changes.  Cardiovascular: negative for chest pain, dyspnea on exertion, edema, orthopnea, palpitations, paroxysmal nocturnal dyspnea or shortness of breath Dermatological: negative for rash Respiratory: negative for cough or wheezing Urologic: negative for hematuria Abdominal: negative for nausea, vomiting, diarrhea, bright red blood per rectum, melena, or hematemesis Neurologic: negative for visual changes, syncope, or dizziness All other systems reviewed and are otherwise negative except as noted above.    Blood pressure (!) 110/56, pulse 70, height 5' 1 (1.549 m), weight 152 lb (68.9 kg).  General appearance: alert and no distress Neck: no adenopathy, no JVD, supple, symmetrical, trachea midline, thyroid not enlarged, symmetric, no tenderness/mass/nodules, and soft left carotid bruit Lungs: clear to auscultation bilaterally Heart: regular rate and rhythm, S1, S2 normal, no murmur, click, rub or gallop Extremities: extremities normal, atraumatic, no cyanosis or edema Pulses: 2+ and symmetric Skin: Skin color, texture, turgor normal. No rashes or lesions Neurologic:  Grossly normal  EKG EKG Interpretation Date/Time:  Wednesday February 26 2024 13:42:59 EDT Ventricular Rate:  70 PR Interval:    QRS Duration:  94 QT Interval:  404 QTC Calculation: 436 R Axis:   21  Text Interpretation: Accelerated Junctional rhythm When compared with ECG of 01-Mar-2023 10:26, Junctional rhythm has replaced Sinus rhythm QRS axis Shifted right Criteria for Septal infarct are no longer Present Confirmed by Court Carrier [phone]) on 02/26/2024 1:43:53 PM    ASSESSMENT AND PLAN:   HYPERCHOLESTEROLEMIA History of hyperlipidemia on statin therapy lipid profile performed 02/16/2024 revealed a total cholesterol of 107, LDL 36 and HDL of 52.  Essential hypertension History of essential hypertension with blood pressure measured today 110/56.  She is on hydrochlorothiazide  12.5 mg a day and metoprolol .  Coronary atherosclerosis History of CAD status post LAD stenting by Dr. Maye August 2005 with moderate disease in her RCA.  She gets occasional atypical chest pain.  Tobacco abuse Ongoing tobacco abuse of 1/2 pack a day recalcitrant to risk factor modification despite a recent diagnosis of lung cancer.     Carrier DOROTHA Court MD FACP,FACC,FAHA, [facility] 02/26/2024 1:54 PM

## 2024-03-02 ENCOUNTER — Other Ambulatory Visit: Payer: Self-pay | Admitting: Cardiovascular Disease

## 2024-03-02 ENCOUNTER — Ambulatory Visit (HOSPITAL_COMMUNITY)

## 2024-03-02 ENCOUNTER — Telehealth: Payer: Self-pay | Admitting: Cardiovascular Disease

## 2024-03-02 DIAGNOSIS — Z720 Tobacco use: Secondary | ICD-10-CM

## 2024-03-02 DIAGNOSIS — I10 Essential (primary) hypertension: Secondary | ICD-10-CM

## 2024-03-02 DIAGNOSIS — I2510 Atherosclerotic heart disease of native coronary artery without angina pectoris: Secondary | ICD-10-CM

## 2024-03-02 DIAGNOSIS — R600 Localized edema: Secondary | ICD-10-CM

## 2024-03-02 DIAGNOSIS — E7800 Pure hypercholesterolemia, unspecified: Secondary | ICD-10-CM

## 2024-03-02 DIAGNOSIS — R0989 Other specified symptoms and signs involving the circulatory and respiratory systems: Secondary | ICD-10-CM

## 2024-03-02 DIAGNOSIS — Z79899 Other long term (current) drug therapy: Secondary | ICD-10-CM

## 2024-03-02 MED ORDER — METOPROLOL TARTRATE 25 MG PO TABS: 12.5000 mg | ORAL_TABLET | Freq: Two times a day (BID) | ORAL | 3 refills | Status: AC

## 2024-03-02 MED ORDER — ISOSORBIDE MONONITRATE ER 30 MG PO TB24: 30.0000 mg | ORAL_TABLET | Freq: Every day | ORAL | 3 refills | Status: AC

## 2024-03-02 MED ORDER — HYDROCHLOROTHIAZIDE 12.5 MG PO CAPS: 12.5000 mg | ORAL_CAPSULE | Freq: Every day | ORAL | 3 refills | Status: AC

## 2024-03-02 MED ORDER — SIMVASTATIN 40 MG PO TABS: ORAL_TABLET | ORAL | 3 refills | Status: AC

## 2024-03-02 NOTE — Telephone Encounter (Signed)
*  STAT* If patient is at the pharmacy, call can be transferred to refill team.     1. Which medications need to be refilled? (please list name of each medication and dose if known) simvastatin  (ZOCOR ) 40 MG tablet   metoprolol  tartrate (LOPRESSOR ) 25 MG tablet    hydrochlorothiazide  (MICROZIDE ) 12.5 MG capsule (Expired)    isosorbide  mononitrate (IMDUR ) 30 MG 24 hr tablet    2. Would you like to learn more about the convenience, safety, & potential cost savings by using the [facility]? No     3. Are you open to using the [facility] (Type [facility].  ). No     4. Which pharmacy/location (including street and city if local pharmacy) is medication to be sent to? [facility] - [place], [place] - [address]       5. Do they need a 30 day or 90 day supply? 90 day

## 2024-03-03 ENCOUNTER — Ambulatory Visit: Admitting: Cardiovascular Disease

## 2024-03-05 ENCOUNTER — Ambulatory Visit (HOSPITAL_BASED_OUTPATIENT_CLINIC_OR_DEPARTMENT_OTHER)
Admission: RE | Admit: 2024-03-05 | Discharge: 2024-03-05 | Disposition: A | Discharge status: Home / Self Care | Source: Ambulatory Visit | Attending: Cardiovascular Disease | Admitting: Cardiovascular Disease

## 2024-03-05 DIAGNOSIS — I2510 Atherosclerotic heart disease of native coronary artery without angina pectoris: Secondary | ICD-10-CM | POA: Insufficient documentation

## 2024-03-05 DIAGNOSIS — R600 Localized edema: Secondary | ICD-10-CM | POA: Insufficient documentation

## 2024-03-05 LAB — ECHOCARDIOGRAM COMPLETE
AR max vel: 1.54 cm2
AV Area VTI: 1.55 cm2
AV Area mean vel: 1.49 cm2
AV Mean grad: 7.5 mmHg
AV Peak grad: 15 mmHg
Ao pk vel: 1.94 m/s
Area-P 1/2: 3.27 cm2
Calc EF: 58 %
MV M vel: 5.2 m/s
MV Peak grad: 108.2 mmHg
S' Lateral: 2.9 cm
Single Plane A2C EF: 56.9 %
Single Plane A4C EF: 58.7 %

## 2024-03-06 ENCOUNTER — Ambulatory Visit (HOSPITAL_COMMUNITY)
Admission: RE | Admit: 2024-03-06 | Discharge: 2024-03-06 | Disposition: A | Discharge status: Home / Self Care | Source: Ambulatory Visit | Attending: Cardiovascular Disease | Admitting: Cardiovascular Disease

## 2024-03-06 DIAGNOSIS — R0989 Other specified symptoms and signs involving the circulatory and respiratory systems: Secondary | ICD-10-CM | POA: Diagnosis present

## 2024-03-07 ENCOUNTER — Ambulatory Visit: Payer: Self-pay | Admitting: Cardiovascular Disease

## 2024-03-11 LAB — BASIC METABOLIC PANEL WITH GFR
BUN/Creatinine Ratio: 12 (ref 12–28)
BUN: 11 mg/dL (ref 8–27)
CO2: 24 mmol/L (ref 20–29)
Calcium: 8.9 mg/dL (ref 8.7–10.3)
Chloride: 106 mmol/L (ref 96–106)
Creatinine, Ser: 0.9 mg/dL (ref 0.57–1.00)
Glucose: 103 mg/dL — ABNORMAL HIGH (ref 70–99)
Potassium: 4.2 mmol/L (ref 3.5–5.2)
Sodium: 145 mmol/L — ABNORMAL HIGH (ref 134–144)
eGFR: 68 mL/min/1.73 (ref >59)

## 2024-05-26 ENCOUNTER — Ambulatory Visit: Admitting: Nurse Practitioner

## 2024-08-05 ENCOUNTER — Encounter: Payer: Self-pay | Admitting: Cardiovascular Disease
